# Patient Record
Sex: Female | Born: 1985 | Race: Black or African American | Hispanic: No | Marital: Single | Smoking: Never smoker
Health system: Southern US, Community
[De-identification: ages and names within clinical notes are randomized; demographics above are authoritative.]

## PROBLEM LIST (undated history)

## (undated) DIAGNOSIS — D649 Anemia, unspecified: Secondary | ICD-10-CM

## (undated) HISTORY — DX: Morbid (severe) obesity due to excess calories: E66.01

## (undated) HISTORY — DX: Anemia, unspecified: D64.9

---

## 2003-09-23 ENCOUNTER — Emergency Department (HOSPITAL_COMMUNITY): Admission: EM | Admit: 2003-09-23 | Discharge: 2003-09-23 | Payer: Self-pay | Admitting: Emergency Medicine

## 2007-10-20 ENCOUNTER — Ambulatory Visit: Payer: Self-pay | Admitting: Family Medicine

## 2010-04-29 ENCOUNTER — Emergency Department (HOSPITAL_COMMUNITY): Admission: EM | Admit: 2010-04-29 | Discharge: 2010-04-29 | Payer: Self-pay | Admitting: Emergency Medicine

## 2014-09-14 ENCOUNTER — Encounter (INDEPENDENT_AMBULATORY_CARE_PROVIDER_SITE_OTHER): Payer: Self-pay

## 2014-09-14 ENCOUNTER — Ambulatory Visit
Admission: RE | Admit: 2014-09-14 | Discharge: 2014-09-14 | Disposition: A | Discharge status: Home / Self Care | Payer: Self-pay | Source: Ambulatory Visit | Attending: Emergency Medicine | Admitting: Emergency Medicine

## 2014-09-14 ENCOUNTER — Other Ambulatory Visit: Payer: Self-pay | Admitting: Emergency Medicine

## 2014-09-14 DIAGNOSIS — R52 Pain, unspecified: Secondary | ICD-10-CM

## 2015-09-08 ENCOUNTER — Emergency Department (HOSPITAL_COMMUNITY)
Admission: EM | Admit: 2015-09-08 | Discharge: 2015-09-08 | Disposition: A | Discharge status: Home / Self Care | Payer: Self-pay | Attending: Emergency Medicine | Admitting: Emergency Medicine

## 2015-09-08 ENCOUNTER — Encounter (HOSPITAL_COMMUNITY): Payer: Self-pay | Admitting: *Deleted

## 2015-09-08 ENCOUNTER — Emergency Department (HOSPITAL_COMMUNITY)
Admission: EM | Admit: 2015-09-08 | Discharge: 2015-09-08 | Disposition: A | Discharge status: Home / Self Care | Payer: Managed Care, Other (non HMO) | Source: Home / Self Care | Attending: Emergency Medicine | Admitting: Emergency Medicine

## 2015-09-08 ENCOUNTER — Encounter (HOSPITAL_COMMUNITY): Payer: Self-pay

## 2015-09-08 DIAGNOSIS — J111 Influenza due to unidentified influenza virus with other respiratory manifestations: Secondary | ICD-10-CM | POA: Insufficient documentation

## 2015-09-08 MED ORDER — OSELTAMIVIR PHOSPHATE 75 MG PO CAPS: 75.0000 mg | ORAL_CAPSULE | Freq: Two times a day (BID) | ORAL | Status: DC

## 2015-09-08 NOTE — ED Notes (Signed)
Vomited large amount in triage after coughing.

## 2015-09-08 NOTE — ED Notes (Signed)
Pt's family member [facility].  Encouraged them to stay.

## 2015-09-08 NOTE — ED Provider Notes (Signed)
CSN: [id number]     Arrival date & time 09/08/15  1924 History   First MD Initiated Contact with Patient 09/08/15 2001     No chief complaint on file.  (Consider location/radiation/quality/duration/timing/severity/associated sxs/prior Treatment) Patient is a 30 y.o. female presenting with cough. The history is provided by the patient. No language interpreter was used.  Cough Cough characteristics:  Non-productive Severity:  Moderate Onset quality:  Sudden Duration:  2 days Timing:  Constant Progression:  Worsening Chronicity:  New Relieved by:  Nothing Worsened by:  Nothing tried Ineffective treatments:  None tried Associated symptoms: fever and sore throat   Associated symptoms: no ear pain and no rash     No past medical history on file. No past surgical history on file. No family history on file. Social History  Substance Use Topics  . Smoking status: Never Smoker   . Smokeless tobacco: Not on file  . Alcohol Use: No   OB History    No data available     Review of Systems  Constitutional: Positive for fever.  HENT: Positive for sore throat. Negative for ear pain.   Respiratory: Positive for cough.   Skin: Negative for rash.  All other systems reviewed and are negative.   Allergies  Review of patient's allergies indicates no known allergies.  Home Medications   Prior to Admission medications   Not on File   Meds Ordered and Administered this Visit  Medications - No data to display  BP 124/82 mmHg  Pulse 105  Temp(Src) 100.8 F (38.2 C) (Oral)  Resp 16  SpO2 100%  LMP 09/08/2015 No data found.   Physical Exam  Constitutional: She is oriented to person, place, and time. She appears well-developed and well-nourished.  HENT:  Head: Normocephalic.  Right Ear: External ear normal.  Left Ear: External ear normal.  Nose: Nose normal.  Mouth/Throat: Oropharynx is clear and moist.  Eyes: Conjunctivae and EOM are normal. Pupils are equal, round, and  reactive to light.  Neck: Normal range of motion.  Cardiovascular: Normal rate and normal heart sounds.   Pulmonary/Chest: Effort normal.  Abdominal: She exhibits no distension.  Musculoskeletal: Normal range of motion.  Neurological: She is alert and oriented to person, place, and time.  Skin: Skin is warm.  Psychiatric: She has a normal mood and affect.  Nursing note and vitals reviewed.   ED Course  Procedures (including critical care time)  Labs Review Labs Reviewed - No data to display  Imaging Review No results found.   Visual Acuity Review  Right Eye Distance:   Left Eye Distance:   Bilateral Distance:    Right Eye Near:   Left Eye Near:    Bilateral Near:         MDM Pt gives a good history of viral like illness,  Probably influenza   1. Influenza    Meds ordered this encounter  Medications  . oseltamivir (TAMIFLU) 75 MG capsule    Sig: Take 1 capsule (75 mg total) by mouth every 12 (twelve) hours.    Dispense:  10 capsule    Refill:  0    Order Specific Question:  Supervising Provider    Answer:  Linna HoffKINDL, JAMES D [phone][[id number]]    An After Visit Summary was printed and given to the patient.  Lonia SkinnerLeslie K MacdoelSofia, PA-C 09/08/15 2018

## 2015-09-08 NOTE — Discharge Instructions (Signed)

## 2015-09-08 NOTE — ED Notes (Signed)
Pt  Reports     Symptoms  Of  Body  Aches       X  2  Days         She   Has  A   Fever   And  The  Symptoms   Of  Fever       Have  Not  Be    releived     By  otc  med

## 2015-09-08 NOTE — ED Notes (Signed)
Pt has had flu at first of March, cough, runny nose.  Woke up this morning with body aches, worse cough, fever.  No respiratory difficulties.  Talking in complete sentences.

## 2016-03-23 ENCOUNTER — Encounter: Payer: Self-pay | Admitting: Family Medicine

## 2016-04-06 ENCOUNTER — Encounter: Payer: Self-pay | Admitting: Family Medicine

## 2016-04-06 ENCOUNTER — Ambulatory Visit (INDEPENDENT_AMBULATORY_CARE_PROVIDER_SITE_OTHER): Payer: Managed Care, Other (non HMO) | Admitting: Family Medicine

## 2016-04-06 VITALS — BP 120/80 | HR 76 | Ht 66.25 in | Wt 267.4 lb

## 2016-04-06 DIAGNOSIS — Z13220 Encounter for screening for lipoid disorders: Secondary | ICD-10-CM | POA: Diagnosis not present

## 2016-04-06 DIAGNOSIS — Z0000 Encounter for general adult medical examination without abnormal findings: Secondary | ICD-10-CM

## 2016-04-06 DIAGNOSIS — H9311 Tinnitus, right ear: Secondary | ICD-10-CM | POA: Diagnosis not present

## 2016-04-06 DIAGNOSIS — Z113 Encounter for screening for infections with a predominantly sexual mode of transmission: Secondary | ICD-10-CM | POA: Diagnosis not present

## 2016-04-06 DIAGNOSIS — Z23 Encounter for immunization: Secondary | ICD-10-CM

## 2016-04-06 HISTORY — DX: Morbid (severe) obesity due to excess calories: E66.01

## 2016-04-06 LAB — POCT URINALYSIS DIPSTICK
Bilirubin, UA: NEGATIVE
GLUCOSE UA: NEGATIVE
Ketones, UA: NEGATIVE
Leukocytes, UA: NEGATIVE
NITRITE UA: NEGATIVE
PROTEIN UA: NEGATIVE
UROBILINOGEN UA: NEGATIVE
pH, UA: 5.5

## 2016-04-06 LAB — CBC WITH DIFFERENTIAL/PLATELET
BASOS PCT: 0 %
Basophils Absolute: 0 cells/uL (ref 0–200)
EOS PCT: 3 %
Eosinophils Absolute: 315 cells/uL (ref 15–500)
HCT: 36.8 % (ref 35.0–45.0)
Hemoglobin: 11.4 g/dL — ABNORMAL LOW (ref 11.7–15.5)
LYMPHS PCT: 31 %
Lymphs Abs: 3255 cells/uL (ref 850–3900)
MCH: 24.6 pg — ABNORMAL LOW (ref 27.0–33.0)
MCHC: 31 g/dL — ABNORMAL LOW (ref 32.0–36.0)
MCV: 79.3 fL — ABNORMAL LOW (ref 80.0–100.0)
MONOS PCT: 7 %
MPV: 11.6 fL (ref 7.5–12.5)
Monocytes Absolute: 735 cells/uL (ref 200–950)
Neutro Abs: 6195 cells/uL (ref 1500–7800)
Neutrophils Relative %: 59 %
PLATELETS: 353 10*3/uL (ref 140–400)
RBC: 4.64 MIL/uL (ref 3.80–5.10)
RDW: 14.8 % (ref 11.0–15.0)
WBC: 10.5 10*3/uL (ref 4.0–10.5)

## 2016-04-06 NOTE — Progress Notes (Signed)
Subjective:    Patient ID: Katie Morgan, female    DOB: 05-Nov-1985, 30 y.o.   MRN: [id number]  HPI Chief Complaint  Patient presents with  . fasting cpe-    fasting cpe- having ringing in right ear(not sure if its cause shes around airplanes all the time). getting flu shot at work   She is new to the practice and here for a complete physical exam. She has a concern regarding hearing a "whooshing sound" in her right ear for the past 4-5 months. States this only occurs when she is laying down or when turning her head a certain way.  She gets her hearing checked at work and last year she states last year her hearing was normal. She works at [employer] and is exposed to loud noises. States she wears ear plugs most of the time.  Denies dizziness, hearing loss, headache.   Previous medical care: no recent medical care.  Last CPE: 2 years ago.   Other providers: none   Social history: Lives alone. single works at [employer].   Denies smoking, drinking alcohol, drug use  Diet: avoids soda but otherwise nothing particular.  Excerise: not active  Immunizations: tdap unknown. Flu- will get this at work.   Health maintenance:  Mammogram: N/A Colonoscopy: N/a Last Gynecological Exam: never Last Menstrual cycle: today. Regular. Bleeds 5-7 days.  Pregnancies: 0 Contraception: has female partners only Last Dental Exam: has been years.  Last Eye Exam: prescription glasses- [employer].   Wears seatbelt always, uses sunscreen, smoke detectors in home and functioning, does not text while driving and feels safe in home environment.   Reviewed allergies, medications, past medical, surgical, family, and social history.   Review of Systems Review of Systems Constitutional: -fever, -chills, -sweats, -unexpected weight change,-fatigue ENT: -runny nose, -ear pain, -sore throat Cardiology:  -chest pain, -palpitations, -edema Respiratory: -cough, -shortness of breath,  -wheezing Gastroenterology: -abdominal pain, -nausea, -vomiting, -diarrhea, -constipation  Hematology: -bleeding or bruising problems Musculoskeletal: -arthralgias, -myalgias, -joint swelling, -back pain Ophthalmology: -vision changes Urology: -dysuria, -difficulty urinating, -hematuria, -urinary frequency, -urgency Neurology: -headache, -weakness, -tingling, -numbness       Objective:   Physical Exam BP 120/80   Pulse 76   Ht 5' 6.25" (1.683 m)   Wt 267 lb 6.4 oz (121.3 kg)   LMP 04/06/2016 (Exact Date)   BMI 42.83 kg/m   General Appearance:    Alert, cooperative, no distress, appears stated age  Head:    Normocephalic, without obvious abnormality, atraumatic  Eyes:    PERRL, conjunctiva/corneas clear, EOM's intact, fundi    benign  Ears:    Normal TM's and external ear canals  Nose:   Nares normal, mucosa normal, no drainage or sinus   tenderness  Throat:   Lips, mucosa, and tongue normal; teeth and gums normal  Neck:   Supple, no lymphadenopathy;  thyroid:  no   enlargement/tenderness/nodules; no carotid   bruit or JVD  Back:    Spine nontender, no curvature, ROM normal, no CVA     tenderness  Lungs:     Clear to auscultation bilaterally without wheezes, rales or     ronchi; respirations unlabored  Chest Wall:    No tenderness or deformity   Heart:    Regular rate and rhythm, S1 and S2 normal, no murmur, rub   or gallop  Breast Exam:    Refused. Plans to reschedule for this and pap.  Abdomen:     Soft, non-tender,  nondistended, normoactive bowel sounds,    no masses, no hepatosplenomegaly  Genitalia:    Refused. Plans to reschedule for this after menses stops  Rectal:    Not performed due to age<40 and no related complaints  Extremities:   No clubbing, cyanosis or edema  Pulses:   2+ and symmetric all extremities  Skin:   Skin color, texture, turgor normal, no rashes or lesions  Lymph nodes:   Cervical, supraclavicular, and axillary nodes normal  Neurologic:   CNII-XII  intact, normal strength, sensation and gait; reflexes 2+ and symmetric throughout          Psych:   Normal mood, affect, hygiene and grooming.    Urinalysis dipstick: blood 1+ (menses)     Assessment & Plan:  Routine general medical examination at a health care facility - Plan: CBC with Differential/Platelet, Comprehensive metabolic panel, POCT urinalysis dipstick, TSH  Screening for STD (sexually transmitted disease) - Plan: HIV antibody, RPR, GC/Chlamydia Probe Amp  Need for Tdap vaccination - Plan: Tdap vaccine greater than or equal to 7yo IM  Screening for lipid disorders - Plan: Lipid panel  Severe obesity (BMI >= 40) (HCC)  Tinnitus of right ear  She passed the hearing test in the office today. Offered to refer her to ENT, patient declines and states she would like to hold off on this for now. Encouraged her to wear ear protection at work consistently. She will let me know if this worsens or if she desires a referral to ENT.  Discussed that her BMI places her in the severe obesity category and recommend that she get serious about diet and exercise. Advised that obesity increases her risk of developing chronic health conditions.  She would like to be tested for STDs. She has female partners only. States she would like to consider getting pregnant in the next 2 years.  She has never had a pap smear and she refuses today. States she started her period. Plans to return for this.  Tdap given today.  Flu shot - she will get this at her job.  Follow up pending labs.

## 2016-04-06 NOTE — Patient Instructions (Addendum)
Call your insurance company and see about a dentist and eye doctor.  We will call you with your lab results.  Return for pap smear.   Preventative Care for Adults - Female      MAINTAIN REGULAR HEALTH EXAMS:  A routine yearly physical is a good way to check in with your primary care provider about your health and preventive screening. It is also an opportunity to share updates about your health and any concerns you have, and receive a thorough all-over exam.   Most health insurance companies pay for at least some preventative services.  Check with your health plan for specific coverages.  WHAT PREVENTATIVE SERVICES DO WOMEN NEED?  Adult women should have their weight and blood pressure checked regularly.   Women age 32 and older should have their cholesterol levels checked regularly.  Women should be screened for cervical cancer with a Pap smear and pelvic exam beginning at either age 32, or 3 years after they become sexually activity.    Breast cancer screening generally begins at age 32 with a mammogram and breast exam by your primary care provider.    Beginning at age 32 and continuing to age 32, women should be screened for colorectal cancer.  Certain people may need continued testing until age 32.  Updating vaccinations is part of preventative care.  Vaccinations help protect against diseases such as the flu.  Osteoporosis is a disease in which the bones lose minerals and strength as we age. Women ages 3965 and over should discuss this with their caregivers, as should women after menopause who have other risk factors.  Lab tests are generally done as part of preventative care to screen for anemia and blood disorders, to screen for problems with the kidneys and liver, to screen for bladder problems, to check blood sugar, and to check your cholesterol level.  Preventative services generally include counseling about diet, exercise, avoiding tobacco, drugs, excessive alcohol consumption,  and sexually transmitted infections.    GENERAL RECOMMENDATIONS FOR GOOD HEALTH:  Healthy diet:  Eat a variety of foods, including fruit, vegetables, animal or vegetable protein, such as meat, fish, chicken, and eggs, or beans, lentils, tofu, and grains, such as rice.  Drink plenty of water daily.  Decrease saturated fat in the diet, avoid lots of red meat, processed foods, sweets, fast foods, and fried foods.  Exercise:  Aerobic exercise helps maintain good heart health. At least 30-40 minutes of moderate-intensity exercise is recommended. For example, a brisk walk that increases your heart rate and breathing. This should be done on most days of the week.   Find a type of exercise or a variety of exercises that you enjoy so that it becomes a part of your daily life.  Examples are running, walking, swimming, water aerobics, and biking.  For motivation and support, explore group exercise such as aerobic class, spin class, Zumba, Yoga,or  martial arts, etc.    Set exercise goals for yourself, such as a certain weight goal, walk or run in a race such as a 5k walk/run.  Speak to your primary care provider about exercise goals.  Disease prevention:  If you smoke or chew tobacco, find out from your caregiver how to quit. It can literally save your life, no matter how long you have been a tobacco user. If you do not use tobacco, never begin.   Maintain a healthy diet and normal weight. Increased weight leads to problems with blood pressure and diabetes.   The  Body Mass Index or BMI is a way of measuring how much of your body is fat. Having a BMI above 27 increases the risk of heart disease, diabetes, hypertension, stroke and other problems related to obesity. Your caregiver can help determine your BMI and based on it develop an exercise and dietary program to help you achieve or maintain this important measurement at a healthful level.  High blood pressure causes heart and blood vessel problems.   Persistent high blood pressure should be treated with medicine if weight loss and exercise do not work.   Fat and cholesterol leaves deposits in your arteries that can block them. This causes heart disease and vessel disease elsewhere in your body.  If your cholesterol is found to be high, or if you have heart disease or certain other medical conditions, then you may need to have your cholesterol monitored frequently and be treated with medication.   Ask if you should have a cardiac stress test if your history suggests this. A stress test is a test done on a treadmill that looks for heart disease. This test can find disease prior to there being a problem.  Menopause can be associated with physical symptoms and risks. Hormone replacement therapy is available to decrease these. You should talk to your caregiver about whether starting or continuing to take hormones is right for you.   Osteoporosis is a disease in which the bones lose minerals and strength as we age. This can result in serious bone fractures. Risk of osteoporosis can be identified using a bone density scan. Women ages 59 and over should discuss this with their caregivers, as should women after menopause who have other risk factors. Ask your caregiver whether you should be taking a calcium supplement and Vitamin D, to reduce the rate of osteoporosis.   Avoid drinking alcohol in excess (more than two drinks per day).  Avoid use of street drugs. Do not share needles with anyone. Ask for professional help if you need assistance or instructions on stopping the use of alcohol, cigarettes, and/or drugs.  Brush your teeth twice a day with fluoride toothpaste, and floss once a day. Good oral hygiene prevents tooth decay and gum disease. The problems can be painful, unattractive, and can cause other health problems. Visit your dentist for a routine oral and dental check up and preventive care every 6-12 months.   Look at your skin regularly.  Use a  mirror to look at your back. Notify your caregivers of changes in moles, especially if there are changes in shapes, colors, a size larger than a pencil eraser, an irregular border, or development of new moles.  Safety:  Use seatbelts 100% of the time, whether driving or as a passenger.  Use safety devices such as hearing protection if you work in environments with loud noise or significant background noise.  Use safety glasses when doing any work that could send debris in to the eyes.  Use a helmet if you ride a bike or motorcycle.  Use appropriate safety gear for contact sports.  Talk to your caregiver about gun safety.  Use sunscreen with a SPF (or skin protection factor) of 15 or greater.  Lighter skinned people are at a greater risk of skin cancer. Don't forget to also wear sunglasses in order to protect your eyes from too much damaging sunlight. Damaging sunlight can accelerate cataract formation.   Practice safe sex. Use condoms. Condoms are used for birth control and to help reduce the spread  of sexually transmitted infections (or STIs).  Some of the STIs are gonorrhea (the clap), chlamydia, syphilis, trichomonas, herpes, HPV (human papilloma virus) and HIV (human immunodeficiency virus) which causes AIDS. The herpes, HIV and HPV are viral illnesses that have no cure. These can result in disability, cancer and death.   Keep carbon monoxide and smoke detectors in your home functioning at all times. Change the batteries every 6 months or use a model that plugs into the wall.   Vaccinations:  Stay up to date with your tetanus shots and other required immunizations. You should have a booster for tetanus every 10 years. Be sure to get your flu shot every year, since 5%-20% of the [place] population comes down with the flu. The flu vaccine changes each year, so being vaccinated once is not enough. Get your shot in the fall, before the flu season peaks.   Other vaccines to consider:  Human Papilloma  Virus or HPV causes cancer of the cervix, and other infections that can be transmitted from person to person. There is a vaccine for HPV, and females should get immunized between the ages of 3 and 28. It requires a series of 3 shots.   Pneumococcal vaccine to protect against certain types of pneumonia.  This is normally recommended for adults age 78 or older.  However, adults younger than 32 years old with certain underlying conditions such as diabetes, heart or lung disease should also receive the vaccine.  Shingles vaccine to protect against Varicella Zoster if you are older than age 73, or younger than 32 years old with certain underlying illness.  Hepatitis A vaccine to protect against a form of infection of the liver by a virus acquired from food.  Hepatitis B vaccine to protect against a form of infection of the liver by a virus acquired from blood or body fluids, particularly if you work in health care.  If you plan to travel internationally, check with your local health department for specific vaccination recommendations.  Cancer Screening:  Breast cancer screening is essential to preventive care for women. All women age 48 and older should perform a breast self-exam every month. At age 20 and older, women should have their caregiver complete a breast exam each year. Women at ages 69 and older should have a mammogram (x-ray film) of the breasts. Your caregiver can discuss how often you need mammograms.    Cervical cancer screening includes taking a Pap smear (sample of cells examined under a microscope) from the cervix (end of the uterus). It also includes testing for HPV (Human Papilloma Virus, which can cause cervical cancer). Screening and a pelvic exam should begin at age 73, or 3 years after a woman becomes sexually active. Screening should occur every year, with a Pap smear but no HPV testing, up to age 42. After age 48, you should have a Pap smear every 3 years with HPV testing, if no  HPV was found previously.   Most routine colon cancer screening begins at the age of 77. On a yearly basis, doctors may provide special easy to use take-home tests to check for hidden blood in the stool. Sigmoidoscopy or colonoscopy can detect the earliest forms of colon cancer and is life saving. These tests use a small camera at the end of a tube to directly examine the colon. Speak to your caregiver about this at age 60, when routine screening begins (and is repeated every 5 years unless early forms of pre-cancerous polyps or small  growths are found).

## 2016-04-07 LAB — COMPREHENSIVE METABOLIC PANEL WITH GFR
ALT: 13 U/L (ref 6–29)
AST: 16 U/L (ref 10–30)
Albumin: 3.7 g/dL (ref 3.6–5.1)
Alkaline Phosphatase: 65 U/L (ref 33–115)
BUN: 11 mg/dL (ref 7–25)
CO2: 26 mmol/L (ref 20–31)
Calcium: 9.2 mg/dL (ref 8.6–10.2)
Chloride: 105 mmol/L (ref 98–110)
Creat: 0.71 mg/dL (ref 0.50–1.10)
Glucose, Bld: 96 mg/dL (ref 65–99)
Potassium: 3.9 mmol/L (ref 3.5–5.3)
Sodium: 140 mmol/L (ref 135–146)
Total Bilirubin: 0.3 mg/dL (ref 0.2–1.2)
Total Protein: 7.5 g/dL (ref 6.1–8.1)

## 2016-04-07 LAB — TSH: TSH: 1.43 m[IU]/L

## 2016-04-07 LAB — HIV ANTIBODY (ROUTINE TESTING W REFLEX): HIV 1&2 Ab, 4th Generation: NONREACTIVE

## 2016-04-07 LAB — LIPID PANEL
Cholesterol: 174 mg/dL (ref 125–200)
HDL: 54 mg/dL
LDL Cholesterol: 108 mg/dL
Total CHOL/HDL Ratio: 3.2 ratio
Triglycerides: 60 mg/dL
VLDL: 12 mg/dL

## 2016-04-07 LAB — GC/CHLAMYDIA PROBE AMP
CT Probe RNA: NOT DETECTED
GC Probe RNA: NOT DETECTED

## 2016-04-07 LAB — SYPHILIS: RPR W/REFLEX TO RPR TITER AND TREPONEMAL ANTIBODIES, TRADITIONAL SCREENING AND DIAGNOSIS ALGORITHM

## 2016-04-22 ENCOUNTER — Other Ambulatory Visit: Payer: Managed Care, Other (non HMO) | Admitting: Family Medicine

## 2016-05-20 ENCOUNTER — Other Ambulatory Visit (HOSPITAL_COMMUNITY)
Admission: RE | Admit: 2016-05-20 | Discharge: 2016-05-20 | Disposition: A | Discharge status: Home / Self Care | Payer: Managed Care, Other (non HMO) | Source: Ambulatory Visit | Attending: Family Medicine | Admitting: Family Medicine

## 2016-05-20 ENCOUNTER — Encounter: Payer: Self-pay | Admitting: Family Medicine

## 2016-05-20 ENCOUNTER — Ambulatory Visit (INDEPENDENT_AMBULATORY_CARE_PROVIDER_SITE_OTHER): Payer: Managed Care, Other (non HMO) | Admitting: Family Medicine

## 2016-05-20 VITALS — BP 130/78 | HR 64 | Wt 272.2 lb

## 2016-05-20 DIAGNOSIS — Z124 Encounter for screening for malignant neoplasm of cervix: Secondary | ICD-10-CM

## 2016-05-20 DIAGNOSIS — Z01419 Encounter for gynecological examination (general) (routine) without abnormal findings: Secondary | ICD-10-CM | POA: Diagnosis present

## 2016-05-20 DIAGNOSIS — Z1151 Encounter for screening for human papillomavirus (HPV): Secondary | ICD-10-CM | POA: Diagnosis not present

## 2016-05-20 DIAGNOSIS — D509 Iron deficiency anemia, unspecified: Secondary | ICD-10-CM | POA: Diagnosis not present

## 2016-05-20 NOTE — Patient Instructions (Signed)
We will call you with your pap smear results.   you are anemic and may benefit from taking daily iron.

## 2016-05-20 NOTE — Progress Notes (Signed)
   Subjective:    Patient ID: Katie Morgan, female    DOB: [birth year], 30 y.o.   MRN: [id number]  HPI Chief Complaint  Patient presents with  . pap    pap only.   She is here today for a Pap smear. She was here for a complete physical exam on 04/06/2016 but was having her menses at that time and deferred the Pap smear. Unknown last pap smear. Denies history of abnormal pap smear. Denies family history of cervical cancer.   She is aware that she is anemic. States this has been ongoing for years related to her periods.   Denies fever, chills, chest pain, shortness of breath, abdominal pain, N/V/D, vaginal discharge.   Reviewed allergies, medications, past medical, surgical, family, and social history.   Review of Systems Pertinent positives and negatives in the history of present illness.     Objective:   Physical Exam  Constitutional: She appears well-developed and well-nourished. No distress.  Genitourinary: Vagina normal and uterus normal. There is no rash, tenderness or lesion on the right labia. There is no rash, tenderness or lesion on the left labia. Cervix exhibits no motion tenderness and no discharge. Right adnexum displays no mass, no tenderness and no fullness. Left adnexum displays no mass, no tenderness and no fullness.  Genitourinary Comments: Chaperone present. Patient tolerated well.   Lymphadenopathy:       Right: No inguinal adenopathy present.       Left: No inguinal adenopathy present.  Skin: Skin is warm and dry. No pallor.   BP 130/78   Pulse 64   Wt 272 lb 3.2 oz (123.5 kg)   BMI 43.60 kg/m       Assessment & Plan:  Encounter for screening for cervical cancer  - Plan: Cytology - PAP  Iron deficiency anemia, unspecified iron deficiency anemia type  Discussed taking iron for anemia and the possibility of constipation with iron. Discussed management of constipation for this.  Follow up pending pap smear results.

## 2016-05-22 LAB — CYTOLOGY - PAP
DIAGNOSIS: NEGATIVE
HPV: NOT DETECTED

## 2017-02-23 ENCOUNTER — Encounter: Payer: Self-pay | Admitting: Family Medicine

## 2017-02-25 ENCOUNTER — Ambulatory Visit: Payer: Managed Care, Other (non HMO) | Admitting: Family Medicine

## 2018-07-26 ENCOUNTER — Ambulatory Visit: Payer: Commercial Managed Care - PPO | Admitting: Family Medicine

## 2018-07-26 ENCOUNTER — Encounter: Payer: Self-pay | Admitting: Family Medicine

## 2018-07-26 VITALS — BP 130/80 | HR 77 | Temp 98.7°F | Wt 273.8 lb

## 2018-07-26 DIAGNOSIS — R110 Nausea: Secondary | ICD-10-CM | POA: Diagnosis not present

## 2018-07-26 DIAGNOSIS — R197 Diarrhea, unspecified: Secondary | ICD-10-CM

## 2018-07-26 MED ORDER — ONDANSETRON 4 MG PO TBDP: 4.0000 mg | ORAL_TABLET | Freq: Three times a day (TID) | ORAL | 0 refills | Status: DC | PRN

## 2018-07-26 NOTE — Progress Notes (Signed)
   Subjective:    Patient ID: Katie Morgan, female    DOB: [birth year], 33 y.o.   MRN: [id number]  HPI Chief Complaint  Patient presents with  . stomach issues    stomach issues- ate something on sunday and had vomitting and diarrhea all night and still feels queezy   She is here with complaints of a 2 day history of N/V/D.  States she ate a buffalo chicken dip 2 days ago and approximately 20 minutes later she was having nausea, vomiting and diarrhea. She still has nausea but no longer vomiting.  Last bowel movement was yesterday evening. Denies blood or pus in her stool.   Drinking ginger ale and crackers and keeping it down. She has not taken anything for her symptoms.   Denies fever, chills, dizziness, headache, chest pain, palpitations, abdominal pain, urinary symptoms.    LMP: 3 weeks ago.  Contraception: same-sex relationship.   Reviewed allergies, medications, past medical, surgical, family, and social history.   Review of Systems Pertinent positives and negatives in the history of present illness.     Objective:   Physical Exam BP 130/80   Pulse 77   Temp 98.7 F (37.1 C) (Oral)   Wt 273 lb 12.8 oz (124.2 kg)   SpO2 98%   BMI 43.86 kg/m   Alert and oriented and in no distress. Tympanic membranes and canals are normal. Pharyngeal area is normal. Neck is supple without adenopathy or thyromegaly. Cardiac exam shows a regular sinus rhythm without murmurs or gallops. Lungs are clear to auscultation. Abdomen is soft, non distended, non tender, normal BS, no palpable masses. Extremities without edema. Skin is warm and dry, no pallor or rash. PERRLA, conjunctiva normal.        Assessment & Plan:  Diarrhea, unspecified type  Nausea - Plan: ondansetron (ZOFRAN ODT) 4 MG disintegrating tablet  Discussed whether her illness is related to a viral etiology versus foodborne illness, treatment is the same.  She will continue with symptomatic treatment.  She appears to be  improving significantly as expected.  Stay well-hydrated.  Zofran prescribed.  She will follow-up if symptoms are worsening.  Declines work note.

## 2018-07-26 NOTE — Patient Instructions (Addendum)
Make sure you are staying well-hydrated.  You can take the Zofran for nausea as needed.  I sent this to your pharmacy.  I recommend that you continue to avoid fried foods, greasy foods or spicy foods for the next couple of days.  I would also be careful with milk products.  If you develop worsening symptoms, let me know.

## 2018-08-07 DIAGNOSIS — E8881 Metabolic syndrome: Secondary | ICD-10-CM | POA: Diagnosis not present

## 2018-08-08 NOTE — Progress Notes (Signed)
Subjective:    Patient ID: Katie Morgan, female    DOB: Jan 28, 1986, 33 y.o.   MRN: [id number]  HPI  Chief Complaint  Patient presents with  . fasting cpe    fasitng cpe, gets eyes checked yearly. wants pap smear done and STD check   She is here for a complete physical exam.  Concerns regarding weight. Planning to start eating healthier and cutting back on soda and fried foods.   Other providers: [facility]- eyes  Dr. Lawrence Marseilles- dentist   Social history: Lives alone, works for [employer]  Denies smoking, drinking alcohol, drug use  Diet: has been unhealthy using an app for calories.  Excerise: M, W, F   Immunizations: flu shot at work. Tdap UTD.   Health maintenance:  Mammogram: N/A Colonoscopy: N/A Last Gynecological Exam: 2017. Requests pap smear.  Last Menstrual cycle: 08/03/2018 Pregnancies: 0 Last Dental Exam: Dr. Lawrence Marseilles  Last Eye Exam: fall 2019   Wears seatbelt always, uses sunscreen, smoke detectors in home and functioning, does not text while driving and feels safe in home environment.   Reviewed allergies, medications, past medical, surgical, family, and social history.   Review of Systems Review of Systems Constitutional: -fever, -chills, -sweats, -unexpected weight change,-fatigue ENT: -runny nose, -ear pain, -sore throat Cardiology:  -chest pain, -palpitations, -edema Respiratory: -cough, -shortness of breath, -wheezing Gastroenterology: -abdominal pain, -nausea, -vomiting, -diarrhea, -constipation  Hematology: -bleeding or bruising problems Musculoskeletal: -arthralgias, -myalgias, -joint swelling, -back pain Ophthalmology: -vision changes Urology: -dysuria, -difficulty urinating, -hematuria, -urinary frequency, -urgency Neurology: -headache, -weakness, -tingling, -numbness       Objective:   Physical Exam BP 120/72   Pulse 72   Ht 5' 5.5" (1.664 m)   Wt 274 lb 6.4 oz (124.5 kg)   LMP 08/03/2018   BMI 44.97 kg/m   General Appearance:     Alert, cooperative, no distress, appears stated age  Head:    Normocephalic, without obvious abnormality, atraumatic  Eyes:    PERRL, conjunctiva/corneas clear, EOM's intact, fundi    benign  Ears:    Normal TM's and external ear canals  Nose:   Nares normal, mucosa normal, no drainage or sinus   tenderness  Throat:   Lips, mucosa, and tongue normal; teeth and gums normal  Neck:   Supple, no lymphadenopathy;  thyroid:  no   enlargement/tenderness/nodules; no carotid   bruit or JVD  Back:    Spine nontender, no curvature, ROM normal, no CVA     tenderness  Lungs:     Clear to auscultation bilaterally without wheezes, rales or     ronchi; respirations unlabored  Chest Wall:    No tenderness or deformity   Heart:    Regular rate and rhythm, S1 and S2 normal, no murmur, rub   or gallop  Breast Exam:    No tenderness, masses, or nipple discharge or inversion.      No axillary lymphadenopathy  Abdomen:     Soft, non-tender, nondistended, normoactive bowel sounds,    no masses, no hepatosplenomegaly  Genitalia:    Normal external genitalia without lesions.  BUS and vagina normal; cervix without lesions, or cervical motion tenderness. No abnormal vaginal discharge.  Uterus and adnexa not enlarged, nontender, no masses.  Pap performed. Chaperone present.   Rectal:    Not performed due to age<40 and no related complaints  Extremities:   No clubbing, cyanosis or edema  Pulses:   2+ and symmetric all extremities  Skin:   Skin  color, texture, turgor normal, no rashes or lesions  Lymph nodes:   Cervical, supraclavicular, and axillary nodes normal  Neurologic:   CNII-XII intact, normal strength, sensation and gait; reflexes 2+ and symmetric throughout          Psych:   Normal mood, affect, hygiene and grooming.    Urinalysis dipstick: Patient was unable to void      Assessment & Plan:  Routine general medical examination at a health care facility - Plan: CBC with Differential/Platelet, Comprehensive  metabolic panel, TSH, T4, free, Lipid panel, CANCELED: POCT Urinalysis DIP (Proadvantage Device)  Iron deficiency anemia, unspecified iron deficiency anemia type - Plan: CBC with Differential/Platelet  Severe obesity (BMI >= 40) (HCC) - Plan: Comprehensive metabolic panel, TSH, T4, free, Hemoglobin A1c, Lipid panel  Screening for cervical cancer - Plan: Cytology - PAP([place])  Screen for STD (sexually transmitted disease) - Plan: RPR, HIV Antibody (routine testing w rflx), Hepatitis C antibody  Screening for diabetes mellitus - Plan: Hemoglobin A1c  She is a pleasant 33 year old female who is here for a fasting CPE.  She has a form for her employer that will need to be filled out once we have her lipids. History of iron deficiency anemia.  Not currently taking iron.  Does not appear to be symptomatic.  We will recheck CBC. Severe obesity-discussed potential long-term health consequences related to obesity including diabetes, hypertension, cancer and arthritis.  In-depth counseling on healthy diet and activity level to reduce weight.  Also discussed possibility of weight loss medication, Bernie Covey, she will call her insurance company and let me know if this is affordable.  She will then need to come back in for an office visit. Discussed that her Pap smear was only 3 years ago and she was negative for HPV.  She requests to have a Pap smear today.  This was done and a chaperone was present.  She also requests STD testing. She is considering the possibility of getting a donor and having a child within the next 2 to 3 years. Immunizations up-to-date.  We did discuss HPV vaccine and she will check with her insurance company to see if this is affordable.  She may return for nurse visit if this is. Follow-up pending lab results.

## 2018-08-09 ENCOUNTER — Encounter: Payer: Self-pay | Admitting: Family Medicine

## 2018-08-09 ENCOUNTER — Ambulatory Visit: Payer: Commercial Managed Care - PPO | Admitting: Family Medicine

## 2018-08-09 ENCOUNTER — Other Ambulatory Visit (HOSPITAL_COMMUNITY)
Admission: RE | Admit: 2018-08-09 | Discharge: 2018-08-09 | Disposition: A | Discharge status: Home / Self Care | Payer: Commercial Managed Care - PPO | Source: Ambulatory Visit | Attending: Family Medicine | Admitting: Family Medicine

## 2018-08-09 VITALS — BP 120/72 | HR 72 | Ht 65.5 in | Wt 274.4 lb

## 2018-08-09 DIAGNOSIS — D509 Iron deficiency anemia, unspecified: Secondary | ICD-10-CM

## 2018-08-09 DIAGNOSIS — Z0000 Encounter for general adult medical examination without abnormal findings: Secondary | ICD-10-CM | POA: Diagnosis not present

## 2018-08-09 DIAGNOSIS — Z124 Encounter for screening for malignant neoplasm of cervix: Secondary | ICD-10-CM | POA: Insufficient documentation

## 2018-08-09 DIAGNOSIS — Z113 Encounter for screening for infections with a predominantly sexual mode of transmission: Secondary | ICD-10-CM

## 2018-08-09 DIAGNOSIS — D649 Anemia, unspecified: Secondary | ICD-10-CM | POA: Insufficient documentation

## 2018-08-09 DIAGNOSIS — Z131 Encounter for screening for diabetes mellitus: Secondary | ICD-10-CM

## 2018-08-09 NOTE — Patient Instructions (Addendum)
You can call your insurance company and find out if [place], a weight loss medication, is affordable for you.  Call and schedule appointment to discuss this if it is. You can also check and see if the HPV vaccine is affordable for you.  This is a 3 shot series as we discussed.  You can call and schedule a nurse visit to get this.  Cut back on carbohydrates (potatoes, rice, pasta, bread)    Preventive Care 18-39 Years, Female Preventive care refers to lifestyle choices and visits with your health care provider that can promote health and wellness. What does preventive care include?   A yearly physical exam. This is also called an annual well check.  Dental exams once or twice a year.  Routine eye exams. Ask your health care provider how often you should have your eyes checked.  Personal lifestyle choices, including: ? Daily care of your teeth and gums. ? Regular physical activity. ? Eating a healthy diet. ? Avoiding tobacco and drug use. ? Limiting alcohol use. ? Practicing safe sex. ? Taking vitamin and mineral supplements as recommended by your health care provider. What happens during an annual well check? The services and screenings done by your health care provider during your annual well check will depend on your age, overall health, lifestyle risk factors, and family history of disease. Counseling Your health care provider may ask you questions about your:  Alcohol use.  Tobacco use.  Drug use.  Emotional well-being.  Home and relationship well-being.  Sexual activity.  Eating habits.  Work and work Statistician.  Method of birth control.  Menstrual cycle.  Pregnancy history. Screening You may have the following tests or measurements:  Height, weight, and BMI.  Diabetes screening. This is done by checking your blood sugar (glucose) after you have not eaten for a while (fasting).  Blood pressure.  Lipid and cholesterol levels. These may be checked  every 5 years starting at age 38.  Skin check.  Hepatitis C blood test.  Hepatitis B blood test.  Sexually transmitted disease (STD) testing.  BRCA-related cancer screening. This may be done if you have a family history of breast, ovarian, tubal, or peritoneal cancers.  Pelvic exam and Pap test. This may be done every 3 years starting at age 54. Starting at age 36, this may be done every 5 years if you have a Pap test in combination with an HPV test. Discuss your test results, treatment options, and if necessary, the need for more tests with your health care provider. Vaccines Your health care provider may recommend certain vaccines, such as:  Influenza vaccine. This is recommended every year.  Tetanus, diphtheria, and acellular pertussis (Tdap, Td) vaccine. You may need a Td booster every 10 years.  Varicella vaccine. You may need this if you have not been vaccinated.  HPV vaccine. If you are 64 or younger, you may need three doses over 6 months.  Measles, mumps, and rubella (MMR) vaccine. You may need at least one dose of MMR. You may also need a second dose.  Pneumococcal 13-valent conjugate (PCV13) vaccine. You may need this if you have certain conditions and were not previously vaccinated.  Pneumococcal polysaccharide (PPSV23) vaccine. You may need one or two doses if you smoke cigarettes or if you have certain conditions.  Meningococcal vaccine. One dose is recommended if you are age 62-21 years and a first-year college student living in a residence hall, or if you have one of several medical conditions.  You may also need additional booster doses.  Hepatitis A vaccine. You may need this if you have certain conditions or if you travel or work in places where you may be exposed to hepatitis A.  Hepatitis B vaccine. You may need this if you have certain conditions or if you travel or work in places where you may be exposed to hepatitis B.  Haemophilus influenzae type b (Hib)  vaccine. You may need this if you have certain risk factors. Talk to your health care provider about which screenings and vaccines you need and how often you need them. This information is not intended to replace advice given to you by your health care provider. Make sure you discuss any questions you have with your health care provider. Document Released: 07/28/2001 Document Revised: 01/12/2017 Document Reviewed: 04/02/2015 Elsevier Interactive Patient Education  2019 [employer].

## 2018-08-10 LAB — CBC WITH DIFFERENTIAL/PLATELET
BASOS ABS: 0.1 10*3/uL (ref 0.0–0.2)
Basos: 1 %
EOS (ABSOLUTE): 0.1 10*3/uL (ref 0.0–0.4)
Eos: 1 %
HEMOGLOBIN: 9.4 g/dL — AB (ref 11.1–15.9)
Hematocrit: 31.1 % — ABNORMAL LOW (ref 34.0–46.6)
IMMATURE GRANS (ABS): 0 10*3/uL (ref 0.0–0.1)
Immature Granulocytes: 0 %
LYMPHS: 30 %
Lymphocytes Absolute: 3.2 10*3/uL — ABNORMAL HIGH (ref 0.7–3.1)
MCH: 21.4 pg — AB (ref 26.6–33.0)
MCHC: 30.2 g/dL — ABNORMAL LOW (ref 31.5–35.7)
MCV: 71 fL — AB (ref 79–97)
MONOCYTES: 6 %
Monocytes Absolute: 0.6 10*3/uL (ref 0.1–0.9)
Neutrophils Absolute: 6.8 10*3/uL (ref 1.4–7.0)
Neutrophils: 62 %
PLATELETS: 369 10*3/uL (ref 150–450)
RBC: 4.39 x10E6/uL (ref 3.77–5.28)
RDW: 16.2 % — ABNORMAL HIGH (ref 11.7–15.4)
WBC: 10.9 10*3/uL — AB (ref 3.4–10.8)

## 2018-08-10 LAB — HIV ANTIBODY (ROUTINE TESTING W REFLEX): HIV Screen 4th Generation wRfx: NONREACTIVE

## 2018-08-10 LAB — COMPREHENSIVE METABOLIC PANEL
A/G RATIO: 1.2 (ref 1.2–2.2)
ALBUMIN: 3.9 g/dL (ref 3.8–4.8)
ALT: 12 IU/L (ref 0–32)
AST: 12 IU/L (ref 0–40)
Alkaline Phosphatase: 79 IU/L (ref 39–117)
BUN / CREAT RATIO: 19 (ref 9–23)
BUN: 13 mg/dL (ref 6–20)
Bilirubin Total: <0.2 mg/dL (ref 0.0–1.2)
CO2: 23 mmol/L (ref 20–29)
Calcium: 9.2 mg/dL (ref 8.7–10.2)
Chloride: 104 mmol/L (ref 96–106)
Creatinine, Ser: 0.7 mg/dL (ref 0.57–1.00)
GFR calc non Af Amer: 113 mL/min/{1.73_m2} (ref >59)
GFR, EST AFRICAN AMERICAN: 131 mL/min/{1.73_m2} (ref >59)
Globulin, Total: 3.3 g/dL (ref 1.5–4.5)
Glucose: 86 mg/dL (ref 65–99)
POTASSIUM: 4 mmol/L (ref 3.5–5.2)
Sodium: 142 mmol/L (ref 134–144)
TOTAL PROTEIN: 7.2 g/dL (ref 6.0–8.5)

## 2018-08-10 LAB — LIPID PANEL
CHOLESTEROL TOTAL: 177 mg/dL (ref 100–199)
Chol/HDL Ratio: 2.9 ratio (ref 0.0–4.4)
HDL: 61 mg/dL (ref >39)
LDL CALC: 105 mg/dL — AB (ref 0–99)
TRIGLYCERIDES: 57 mg/dL (ref 0–149)
VLDL Cholesterol Cal: 11 mg/dL (ref 5–40)

## 2018-08-10 LAB — CYTOLOGY - PAP
CHLAMYDIA, DNA PROBE: NEGATIVE
DIAGNOSIS: NEGATIVE
HPV: NOT DETECTED
Neisseria Gonorrhea: NEGATIVE
Trichomonas: NEGATIVE

## 2018-08-10 LAB — HEMOGLOBIN A1C
Est. average glucose Bld gHb Est-mCnc: 103 mg/dL
Hgb A1c MFr Bld: 5.2 % (ref 4.8–5.6)

## 2018-08-10 LAB — HEPATITIS C ANTIBODY: Hep C Virus Ab: <0.1 s/co ratio (ref 0.0–0.9)

## 2018-08-10 LAB — RPR: RPR: NONREACTIVE

## 2018-08-10 LAB — T4, FREE: Free T4: 1.19 ng/dL (ref 0.82–1.77)

## 2018-08-10 LAB — TSH: TSH: 2.08 u[IU]/mL (ref 0.450–4.500)

## 2018-08-15 DIAGNOSIS — E8881 Metabolic syndrome: Secondary | ICD-10-CM | POA: Diagnosis not present

## 2018-09-25 DIAGNOSIS — E8881 Metabolic syndrome: Secondary | ICD-10-CM | POA: Diagnosis not present

## 2018-10-10 ENCOUNTER — Ambulatory Visit: Payer: Commercial Managed Care - PPO | Admitting: Family Medicine

## 2019-01-30 ENCOUNTER — Encounter: Payer: Self-pay | Admitting: Family Medicine

## 2019-04-27 ENCOUNTER — Encounter: Payer: Self-pay | Admitting: Family Medicine

## 2019-05-04 ENCOUNTER — Encounter: Payer: Self-pay | Admitting: Family Medicine

## 2019-05-04 ENCOUNTER — Ambulatory Visit: Payer: Commercial Managed Care - PPO | Admitting: Family Medicine

## 2019-05-04 VITALS — BP 120/70 | HR 76 | Temp 96.6°F | Wt 284.8 lb

## 2019-05-04 DIAGNOSIS — M25612 Stiffness of left shoulder, not elsewhere classified: Secondary | ICD-10-CM

## 2019-05-04 DIAGNOSIS — M25512 Pain in left shoulder: Secondary | ICD-10-CM

## 2019-05-04 DIAGNOSIS — G8929 Other chronic pain: Secondary | ICD-10-CM

## 2019-05-04 NOTE — Progress Notes (Signed)
   Subjective:    Patient ID: Katie Morgan, female    DOB: 1985-10-04, 33 y.o.   MRN: [id number]  HPI Chief Complaint  Patient presents with  . left arm/shoulder pain    pain for the last year.    Complains of left shoulder pain x 1 year. The pain is intermittent and worsening. States she has limited ROM. Pain is mostly lateral and is non radiating.  States it feels like it is getting "stuck" in certain positions. States pain is severe when she lays on her left side.  No numbness, tingling or weakness.  Denies fever, chills, N/V/D.  No other arthralgias or myalgias.  She has not been taking medication for pain.   Builds airplanes for a living.   Reviewed allergies, medications, past medical, surgical, family, and social history.  Review of Systems Pertinent positives and negatives in the history of present illness.     Objective:   Physical Exam Constitutional:      General: She is not in acute distress.    Appearance: She is not ill-appearing.  Neck:     Musculoskeletal: Normal range of motion and neck supple.  Musculoskeletal:     Left shoulder: She exhibits decreased range of motion. She exhibits no swelling, normal pulse and normal strength.     Cervical back: Normal.     Comments: Left shoulder- limited ROM. No sulcus, negative drop arm, negative Neers and Hawkins.  LUE is neurovascularly intact   Skin:    General: Skin is warm and dry.     Capillary Refill: Capillary refill takes less than 2 seconds.  Neurological:     Mental Status: She is alert.    BP 120/70   Pulse 76   Temp (!) 96.6 F (35.9 C)   Wt 284 lb 12.8 oz (129.2 kg)   BMI 46.67 kg/m       Assessment & Plan:  Chronic left shoulder pain - Plan: Ambulatory referral to Orthopedic Surgery  Decreased range of motion of left shoulder - Plan: Ambulatory referral to Orthopedic Surgery  1 year hx of left shoulder pain that has not been evaluated.  Discussed possible treatment options including X  ray and PT or referral to ortho. She would like to establish care with an orthopedist.  Voltaren gel sample provided.

## 2019-05-04 NOTE — Patient Instructions (Signed)
You can use the Voltaren gel as needed.   You should receive a call from [facility] to schedule a visit.

## 2019-05-09 ENCOUNTER — Encounter: Payer: Self-pay | Admitting: Orthopaedic Surgery

## 2019-05-09 ENCOUNTER — Ambulatory Visit: Payer: Commercial Managed Care - PPO | Admitting: Orthopaedic Surgery

## 2019-05-09 ENCOUNTER — Other Ambulatory Visit: Payer: Self-pay

## 2019-05-09 ENCOUNTER — Ambulatory Visit: Payer: Self-pay

## 2019-05-09 DIAGNOSIS — M25512 Pain in left shoulder: Secondary | ICD-10-CM

## 2019-05-09 DIAGNOSIS — M25511 Pain in right shoulder: Secondary | ICD-10-CM

## 2019-05-09 DIAGNOSIS — G8929 Other chronic pain: Secondary | ICD-10-CM | POA: Diagnosis not present

## 2019-05-09 MED ORDER — BUPIVACAINE HCL 0.25 % IJ SOLN
2.0000 mL | INTRAMUSCULAR | Status: AC | PRN
  Administered 2019-05-09: 16:00:00 2 mL via INTRA_ARTICULAR

## 2019-05-09 MED ORDER — LIDOCAINE HCL 2 % IJ SOLN
2.0000 mL | INTRAMUSCULAR | Status: AC | PRN
  Administered 2019-05-09: 16:00:00 2 mL

## 2019-05-09 MED ORDER — METHYLPREDNISOLONE ACETATE 40 MG/ML IJ SUSP
40.0000 mg | INTRAMUSCULAR | Status: AC | PRN
  Administered 2019-05-09: 16:00:00 40 mg via INTRA_ARTICULAR

## 2019-05-09 NOTE — Progress Notes (Signed)
Office Visit Note   Patient: Katie Morgan           Date of Birth: 04-22-1986           MRN: [id number] Visit Date: 05/09/2019              Requested by: Girtha Rm, NP-C [place],  [place] [address] PCP: Girtha Rm, NP-C   Assessment & Plan: Visit Diagnoses:  1. Chronic left shoulder pain     Plan: Impression is left shoulder subacromial bursitis and biceps tendinitis.  We will proceed with left shoulder subacromial cortisone injection today.  She will follow up with [place] as needed.  Follow-Up Instructions: Return if symptoms worsen or fail to improve.   Orders:  Orders Placed This Encounter  Procedures  . Large Joint Inj: L subacromial bursa  . XR Shoulder Left   No orders of the defined types were placed in this encounter.     Procedures: Large Joint Inj: L subacromial bursa on 05/09/2019 3:47 PM Indications: pain Details: 22 G needle Medications: 2 mL bupivacaine 0.25 %; 2 mL lidocaine 2 %; 40 mg methylPREDNISolone acetate 40 MG/ML Outcome: tolerated well, no immediate complications Patient was prepped and draped in the usual sterile fashion.       Clinical Data: No additional findings.   Subjective: Chief Complaint  Patient presents with  . Left Shoulder - Pain    HPI patient is a pleasant 33 year old female who presents to our clinic today with left shoulder pain.  This began approximately 1 year ago and has worsened over time.  No known injury or change in activity.  The pain she has is to the anterior shoulder and occasionally radiates into the deltoid.  No associated weakness.  She has pain with abduction and abduction of the shoulder.  She has tried anti-inflammatory cream without relief of symptoms.  She denies any numbness, tingling or burning.  No previous cortisone injection to the shoulder.  Review of Systems as detailed in HPI.  All others reviewed and are negative.   Objective: Vital Signs: There were no vitals  taken for this visit.  Physical Exam well-developed and well-nourished female in no acute distress.  Alert and oriented x3.  Ortho Exam examination of the left shoulder she has full active range of motion.  She can internally rotate to T12.  Positive empty can.  Positive cross body adduction.  Positive speeds test.  No tenderness to the [facility] joint.  She does have moderate tenderness to the bicipital groove.  She has full strength throughout.  She is neurovascularly intact distally.  Specialty Comments:  No specialty comments available.  Imaging: Xr Shoulder Left  Result Date: 05/09/2019 No acute or structural abnormalities    PMFS History: Patient Active Problem List   Diagnosis Date Noted  . Anemia   . Morbid obesity ([place])   . Anemia, iron deficiency 05/20/2016  . Severe obesity (BMI >= 40) ([place]) 04/06/2016  . 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 Anemia   . Morbid obesity ([place])     Family History  Problem Relation Age of Onset  . Obesity Mother   . Obesity Father   . Cancer Maternal Grandmother        Leu Gehrigs  . Breast cancer Maternal Aunt 50    History reviewed. No pertinent surgical history. Social History   Occupational History  . Not on file  Tobacco  Use  . Smoking status: Never Smoker  . Smokeless tobacco: Never Used  Substance and Sexual Activity  . Alcohol use: No  . Drug use: No  . Sexual activity: Not Currently

## 2019-05-31 ENCOUNTER — Ambulatory Visit: Payer: Commercial Managed Care - PPO | Attending: Internal Medicine

## 2019-05-31 ENCOUNTER — Other Ambulatory Visit: Payer: Self-pay

## 2019-05-31 DIAGNOSIS — Z20822 Contact with and (suspected) exposure to covid-19: Secondary | ICD-10-CM

## 2019-06-03 LAB — NOVEL CORONAVIRUS, NAA

## 2019-06-06 ENCOUNTER — Ambulatory Visit: Payer: Commercial Managed Care - PPO | Attending: Internal Medicine

## 2019-06-06 DIAGNOSIS — Z20822 Contact with and (suspected) exposure to covid-19: Secondary | ICD-10-CM

## 2019-06-08 LAB — NOVEL CORONAVIRUS, NAA: SARS-CoV-2, NAA: NOT DETECTED

## 2019-06-21 ENCOUNTER — Telehealth: Payer: Self-pay | Admitting: Orthopedic Surgery

## 2019-06-21 NOTE — Telephone Encounter (Signed)
Katie Morgan called in to let Dr. Roda Shutters know that her shoulder injection lasted about a week, but now her pain is worse.  She states that he mentioned getting an MRI scan if her pain did not improve.  Does she need a follow up appointment first or can we just order the MRI?  Please advise.

## 2019-06-22 NOTE — Telephone Encounter (Signed)
Please make appt with Dr. Roda Shutters for patient. Thanks.

## 2019-06-22 NOTE — Telephone Encounter (Signed)
Follow up appointment first.

## 2019-06-29 NOTE — Telephone Encounter (Signed)
Scheduled pt an appt 07-03-2018 @3 :30 with Dr.Xu.

## 2019-07-04 ENCOUNTER — Encounter: Payer: Self-pay | Admitting: Orthopaedic Surgery

## 2019-07-04 ENCOUNTER — Other Ambulatory Visit: Payer: Self-pay

## 2019-07-04 ENCOUNTER — Ambulatory Visit: Payer: Commercial Managed Care - PPO | Admitting: Orthopaedic Surgery

## 2019-07-04 DIAGNOSIS — M25512 Pain in left shoulder: Secondary | ICD-10-CM | POA: Diagnosis not present

## 2019-07-04 DIAGNOSIS — G8929 Other chronic pain: Secondary | ICD-10-CM | POA: Diagnosis not present

## 2019-07-04 MED ORDER — TRAMADOL HCL 50 MG PO TABS: 50.0000 mg | ORAL_TABLET | Freq: Three times a day (TID) | ORAL | 0 refills | Status: DC | PRN

## 2019-07-04 NOTE — Progress Notes (Signed)
Office Visit Note   Patient: Katie Morgan           Date of Birth: 10-07-1985           MRN: [id number] Visit Date: 07/04/2019              Requested by: Avanell Shackleton, NP-C [address] [place],  [place] [address] PCP: Avanell Shackleton, NP-C   Assessment & Plan: Visit Diagnoses:  1. Chronic left shoulder pain     Plan: Impression is continued left shoulder pain questionable rotator cuff tendinitis versus biceps tendinitis.  At this point, we will obtain an MRI to evaluate for structural abnormalities.  She will follow-up with [place] once that is been completed.  Follow-Up Instructions: Return for after MRI.   Orders:  Orders Placed This Encounter  Procedures  . MR Shoulder Left w/o contrast   Meds ordered this encounter  Medications  . traMADol (ULTRAM) 50 MG tablet    Sig: Take 1 tablet (50 mg total) by mouth 3 (three) times daily as needed.    Dispense:  30 tablet    Refill:  0      Procedures: No procedures performed   Clinical Data: No additional findings.   Subjective: Chief Complaint  Patient presents with  . Left Shoulder - Pain, Follow-up    HPI patient is a pleasant 34 year old female who presents our clinic today with continued left shoulder pain.  She was seen in our office about 2 months ago for this.  She has had pain to the left shoulder for over 1 year.  She never had a specific injury.  Pain she has is to the top of the shoulder and radiates into the deltoid at times.  The pain is aggravated with any range of motion of the shoulder.  No weakness.  We injected her subacromial space about 2 months ago which provided mild relief of symptoms for about a week and a half.  Review of Systems as detailed in HPI.  All others reviewed and are negative.   Objective: Vital Signs: There were no vitals taken for this visit.  Physical Exam well-developed and well-nourished female in no acute distress.  Alert and oriented x3.  Ortho Exam examination  of her left shoulder reveals forward flexion to about 160 degrees.  She has near full external and internal range of motion.  Positive empty can test and positive speeds test.  Negative O'Brien's.  Full strength.  She is neurovascular intact distally.  Specialty Comments:  No specialty comments available.  Imaging: No new imaging   PMFS History: Patient Active Problem List   Diagnosis Date Noted  . Anemia   . Morbid obesity (HCC)   . Anemia, iron deficiency 05/20/2016  . Severe obesity (BMI >= 40) (HCC) 04/06/2016  . 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 Anemia   . Morbid obesity (HCC)     Family History  Problem Relation Age of Onset  . Obesity Mother   . Obesity Father   . Cancer Maternal Grandmother        Leu Gehrigs  . Breast cancer Maternal Aunt 50    History reviewed. No pertinent surgical history. Social History   Occupational History  . Not on file  Tobacco Use  . Smoking status: Never Smoker  . Smokeless tobacco: Never Used  Substance and Sexual Activity  . Alcohol use: No  . Drug use: No  .  Sexual activity: Not Currently

## 2019-07-11 ENCOUNTER — Ambulatory Visit: Payer: Commercial Managed Care - PPO | Attending: Internal Medicine

## 2019-07-11 DIAGNOSIS — Z20822 Contact with and (suspected) exposure to covid-19: Secondary | ICD-10-CM

## 2019-07-12 LAB — NOVEL CORONAVIRUS, NAA: SARS-CoV-2, NAA: NOT DETECTED

## 2019-08-03 ENCOUNTER — Other Ambulatory Visit: Payer: Self-pay | Admitting: Physician Assistant

## 2019-08-04 ENCOUNTER — Other Ambulatory Visit: Payer: Commercial Managed Care - PPO

## 2019-08-08 ENCOUNTER — Ambulatory Visit: Payer: Commercial Managed Care - PPO | Admitting: Orthopaedic Surgery

## 2019-08-08 ENCOUNTER — Ambulatory Visit
Admission: RE | Admit: 2019-08-08 | Discharge: 2019-08-08 | Disposition: A | Discharge status: Home / Self Care | Payer: Commercial Managed Care - PPO | Source: Ambulatory Visit | Attending: Physician Assistant | Admitting: Physician Assistant

## 2019-08-08 DIAGNOSIS — G8929 Other chronic pain: Secondary | ICD-10-CM

## 2019-08-09 NOTE — Progress Notes (Signed)
F/u when xu in office

## 2019-08-15 ENCOUNTER — Ambulatory Visit: Payer: Commercial Managed Care - PPO | Admitting: Orthopaedic Surgery

## 2019-08-15 ENCOUNTER — Encounter: Payer: Self-pay | Admitting: Orthopaedic Surgery

## 2019-08-15 ENCOUNTER — Other Ambulatory Visit: Payer: Self-pay

## 2019-08-15 DIAGNOSIS — M25512 Pain in left shoulder: Secondary | ICD-10-CM

## 2019-08-15 DIAGNOSIS — G8929 Other chronic pain: Secondary | ICD-10-CM | POA: Diagnosis not present

## 2019-08-15 NOTE — Progress Notes (Signed)
Office Visit Note   Patient: Katie Morgan           Date of Birth: 1985-10-23           MRN: [id number] Visit Date: 08/15/2019              Requested by: Girtha Rm, NP-C [place],  [place] [address] PCP: Girtha Rm, NP-C   Assessment & Plan: Visit Diagnoses:  1. 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  No full-thickness tears.  Type II acromion.  These findings were reviewed with the patient in detail and based on discussion she would like to be referred to Dr. Junius Roads for an ultrasound-guided left shoulder injection.  Overall this has been bothering her for about a year.  Fortunately at work she is no longer doing any heavy lifting or physical laboring.  She will follow-up if she does not get much relief from this injection and physical therapy.  We briefly discussed arthroscopic debridement and acromioplasty should she not receive much relief from the injection and physical therapy.  Follow-Up Instructions: Return for needs appt with Dr. Junius Roads for ultrasound guided left shoulder injection.   Orders:  Orders Placed This Encounter  Procedures  . Ambulatory referral to Physical Therapy   No orders of the defined types were placed in this encounter.     Procedures: No procedures performed   Clinical Data: No additional findings.   Subjective: Chief Complaint  Patient presents with  . Left Shoulder - Pain    Daje returns today for MRI review of the left shoulder.  Previous subacromial injection back in November 2020 gave her about 2 days of relief.  Medications have not given her any relief.   Review of Systems  Constitutional: Negative.   HENT: Negative.   Eyes: Negative.   Respiratory: Negative.   Cardiovascular: Negative.   Endocrine: Negative.   Musculoskeletal: Negative.   Neurological: Negative.   Hematological: Negative.    Psychiatric/Behavioral: Negative.   All other systems reviewed and are negative.    Objective: Vital Signs: There were no vitals taken for this visit.  Physical Exam Vitals and nursing note reviewed.  Constitutional:      Appearance: She is well-developed.  Pulmonary:     Effort: Pulmonary effort is normal.  Skin:    General: Skin is warm.     Capillary Refill: Capillary refill takes less than 2 seconds.  Neurological:     Mental Status: She is alert and oriented to person, place, and time.  Psychiatric:        Behavior: Behavior normal.        Thought Content: Thought content normal.        Judgment: Judgment normal.     Ortho Exam Left shoulder exam shows mild pain with elevation of the arm above her head.  Manual muscle testing is grossly normal. Specialty Comments:  No specialty comments available.  Imaging: No results found.   PMFS History: Patient Active Problem List   Diagnosis Date Noted  . Anemia   . Morbid obesity ([place])   . Anemia, iron deficiency 05/20/2016  . Severe obesity (BMI >= 40) ([place]) 04/06/2016  . 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 Anemia   . Morbid obesity ([place])     Family History  Problem Relation Age of Onset  . Obesity  Mother   . Obesity Father   . Cancer Maternal Grandmother        Leu Gehrigs  . Breast cancer Maternal Aunt 50    History reviewed. No pertinent surgical history. Social History   Occupational History  . Not on file  Tobacco Use  . Smoking status: Never Smoker  . Smokeless tobacco: Never Used  Substance and Sexual Activity  . Alcohol use: No  . Drug use: No  . Sexual activity: Not Currently

## 2019-08-18 ENCOUNTER — Ambulatory Visit: Payer: Commercial Managed Care - PPO | Admitting: Family Medicine

## 2019-08-18 ENCOUNTER — Ambulatory Visit: Payer: Self-pay

## 2019-08-18 ENCOUNTER — Other Ambulatory Visit: Payer: Self-pay

## 2019-08-18 ENCOUNTER — Encounter: Payer: Self-pay | Admitting: Family Medicine

## 2019-08-18 DIAGNOSIS — G8929 Other chronic pain: Secondary | ICD-10-CM

## 2019-08-18 DIAGNOSIS — M25512 Pain in left shoulder: Secondary | ICD-10-CM | POA: Diagnosis not present

## 2019-08-18 MED ORDER — HYDROCODONE-ACETAMINOPHEN 5-325 MG PO TABS: 1.0000 | ORAL_TABLET | Freq: Four times a day (QID) | ORAL | 0 refills | Status: DC | PRN

## 2019-08-18 NOTE — Progress Notes (Signed)
Subjective: Patient is here for ultrasound-guided intra-articular left glenohumeral injection.  Unfortunately she did not get any significant relief with subacromial injection.  Objective: Anterior pain with active range of motion.  Procedure: Ultrasound-guided left glenohumeral injection: After sterile prep with Betadine, injected 8 cc 1% lidocaine without epinephrine and 40 mg methylprednisolone using a 22-gauge spinal needle, passing the needle through approach into the glenohumeral joint.  Injectate was seen filling the joint capsule.  Good immediate relief.

## 2019-08-30 ENCOUNTER — Ambulatory Visit: Payer: Commercial Managed Care - PPO | Admitting: Physical Therapy

## 2019-08-30 ENCOUNTER — Encounter: Payer: Self-pay | Admitting: Physical Therapy

## 2019-08-30 ENCOUNTER — Other Ambulatory Visit: Payer: Self-pay

## 2019-08-30 DIAGNOSIS — M6281 Muscle weakness (generalized): Secondary | ICD-10-CM | POA: Diagnosis not present

## 2019-08-30 DIAGNOSIS — M25612 Stiffness of left shoulder, not elsewhere classified: Secondary | ICD-10-CM

## 2019-08-30 DIAGNOSIS — M25512 Pain in left shoulder: Secondary | ICD-10-CM | POA: Diagnosis not present

## 2019-08-30 DIAGNOSIS — M542 Cervicalgia: Secondary | ICD-10-CM

## 2019-08-30 DIAGNOSIS — G8929 Other chronic pain: Secondary | ICD-10-CM

## 2019-08-30 NOTE — Therapy (Signed)
[facility] Physical Therapy [address] [place], [place], [address] Phone: [phone]   Fax:  [phone]  Physical Therapy Evaluation  Patient Details  Name: Katie Morgan MRN: [id number] Date of Birth: 01-12-1986 Referring Provider (PT): Eunice Blase, MD   Encounter Date: 08/30/2019  PT End of Session - 08/30/19 1550    Visit Number  1    Number of Visits  8    Date for PT Re-Evaluation  10/25/19    PT Start Time  7989    PT Stop Time  1615    PT Time Calculation (min)  45 min    Activity Tolerance  Patient tolerated treatment well    Behavior During Therapy  [facility] for tasks assessed/performed       Past Medical History:  Diagnosis Date  . Anemia   . Morbid obesity ([place])     History reviewed. No pertinent surgical history.  There were no vitals filed for this visit.   Subjective Assessment - 08/30/19 1539    Subjective  Pt arriving to therapy reporting no pain currently in her L shoulder after having a shoulder injection. Pt reports having pain in the morning when she first gets out of bed due to sleeping wrong. Pt reporting waking up in the middle of the night with her L arm completely numb. Pt reporting her shoulder pain has been going on for years.    Limitations  House hold activities    Patient Stated Goals  Return to gym and be able to sleep without pain    Currently in Pain?  No/denies    Pain Location  Shoulder    Pain Orientation  Left    Pain Descriptors / Indicators  Aching;Numbness;Tingling;Other (Comment)    Pain Type  Chronic pain    Pain Onset  More than a month ago    Pain Frequency  Intermittent    Aggravating Factors   sleeping, reaching repeatively    Pain Relieving Factors  resting, changing positions    Effect of Pain on Daily Activities  unable to sleep comfortably         [facility] PT Assessment - 08/30/19 0001      Assessment   Medical Diagnosis  L shoulder pain    Referring Provider (PT)  Hilts, Michael, MD    Hand Dominance  Right    Prior Therapy  no      Precautions   Precautions  None      Restrictions   Weight Bearing Restrictions  No      Balance Screen   Has the patient fallen in the past 6 months  No    Is the patient reluctant to leave their home because of a fear of falling?   No      Home Film/video editor residence      Prior Function   Level of Independence  Independent    Vocation  Full time employment    Vocation Requirements  builds wings for [employer]   Overall Cognitive Status  Within Functional Limits for tasks assessed      Posture/Postural Control   Posture/Postural Control  Postural limitations    Postural Limitations  Rounded Shoulders;Forward head;Increased lumbar lordosis;Increased thoracic kyphosis      ROM / Strength   AROM / PROM / Strength  AROM;Strength      AROM   AROM Assessment Site  Shoulder    Right/Left Shoulder  Right;Left    Right Shoulder Extension  70 Degrees    Right Shoulder Flexion  165 Degrees    Right Shoulder ABduction  160 Degrees    Right Shoulder Internal Rotation  90 Degrees   abducted shoulder at 90 degrees   Right Shoulder External Rotation  90 Degrees    Left Shoulder Extension  65 Degrees    Left Shoulder Flexion  150 Degrees    Left Shoulder ABduction  160 Degrees    Left Shoulder Internal Rotation  90 Degrees    Left Shoulder External Rotation  75 Degrees   painful     Strength   Overall Strength  Deficits    Strength Assessment Site  Shoulder    Right/Left Shoulder  Right;Left    Right Shoulder Flexion  5/5    Right Shoulder Extension  5/5    Right Shoulder ABduction  5/5    Right Shoulder Internal Rotation  5/5    Right Shoulder External Rotation  5/5    Left Shoulder Flexion  4/5    Left Shoulder Extension  4/5    Left Shoulder ABduction  4/5    Left Shoulder Internal Rotation  4/5    Left Shoulder External Rotation  4/5      Palpation   Palpation comment  TTP:  anterior shoulder, supraspinatus       Special Tests    Special Tests  Rotator Cuff Impingement    Rotator Cuff Impingment tests  Empty Can test      Empty Can test   Findings  Positive    Side  Left      Ambulation/Gait   Gait Pattern  Within Functional Limits                Objective measurements completed on examination: See above findings.      [facility] Adult PT Treatment/Exercise - 08/30/19 0001      Exercises   Exercises  Shoulder      Shoulder Exercises: Supine   External Rotation  AAROM;Both;10 reps    Flexion  AAROM;Left;10 reps      Shoulder Exercises: Standing   Row  AROM;Strengthening;Both;10 reps    Other Standing Exercises  serratus punches using red theraband (Level 2)      Shoulder Exercises: Pulleys   Flexion  2 minutes      Shoulder Exercises: Stretch   Other Shoulder Stretches  upper trap stretch x 2 holding 10 seconds             PT Education - 08/30/19 1548    Education Details  PT POC, HEP    Person(s) Educated  Patient    Methods  Explanation;Demonstration;Other (comment)    Comprehension  Verbalized understanding;Returned demonstration          PT Long Term Goals - 08/30/19 1643      PT LONG TERM GOAL #1   Title  pt will be indepedent in her HEP and progression,    Time  6    Period  Weeks    Status  New      PT LONG TERM GOAL #2   Title  Pt will improve her L shoulder flexion to >/= 165 degrees.    Baseline  see flowsheets    Time  6    Period  Weeks    Status  New      PT LONG TERM GOAL #3   Title  Pt will report 50% improvement in sleeping.  Baseline  pt reporting waking up with pain and L arm completely numb    Time  6    Period  Weeks    Status  New      PT LONG TERM GOAL #4   Title  Pt will improve her L shoulder strength to >/=4+/5 in order to improve functional mobility and work related activities.    Time  6    Period  Weeks    Status  New             Plan - 08/30/19 1617    Clinical  Impression Statement  Pt presenting to therpay for evaluation of L shoulder pain. Pt dx with L rotator cuff tendionpathy. Pt still reporting difficulty sleeping with numbness reported at night and increased pain.  Pt with no pain at rest following injection 2 weeks ago. Pt was tender to palpation with active trigger points noted in L upper trap, mid trap and anterior deltoid. Pt with mild limitations in AROM and strength when compared to R UE. Pt could benefit from skilled PT to progress toward PLOF.    Examination-Activity Limitations  Carry;Reach Overhead;Sleep    Examination-Participation Restrictions  Other    Stability/Clinical Decision Making  Stable/Uncomplicated    Clinical Decision Making  Low    Rehab Potential  Good    PT Frequency  2x / week    PT Duration  6 weeks    PT Treatment/Interventions  ADLs/Self Care Home Management;Cryotherapy;Ultrasound;Traction;Moist Heat;Iontophoresis 4mg /ml Dexamethasone;Electrical Stimulation;Gait training;Stair training;Functional mobility training;Neuromuscular re-education;Therapeutic exercise;Therapeutic activities;Patient/family education;Manual techniques;Passive range of motion;Dry needling;Taping    PT Next Visit Plan  DN, shoulder ROM, mid trap stretch and levator stretch, postural exercises    PT Home Exercise Plan  [email or web address]    Consulted and Agree with Plan of Care  Patient       Patient will benefit from skilled therapeutic intervention in order to improve the following deficits and impairments:  Pain, Postural dysfunction, Decreased strength, Impaired UE functional use, Decreased range of motion  Visit Diagnosis: Chronic left shoulder pain  Muscle weakness (generalized)  Stiffness of left shoulder, not elsewhere classified  Cervicalgia     Problem List Patient Active Problem List   Diagnosis Date Noted  . Anemia   . Morbid obesity (HCC)   . Anemia, iron deficiency 05/20/2016  . Severe obesity (BMI >= 40) (HCC) 04/06/2016  .  Tinnitus of right ear 04/06/2016    04/08/2016, PT 08/30/2019, 4:45 PM  [facility] Physical Therapy [address] [place], [place], [place] Phone: [phone]   Fax:  [phone]  Name: MYLA MAURIELLO MRN: Illene Bolus Date of Birth: [birth year]

## 2019-08-30 NOTE — Patient Instructions (Signed)
Access Code: [email or web address] URL: [email or web address] Date: 08/30/2019 Prepared by: Narda Amber  Exercises Supine Shoulder Flexion Extension AAROM with Dowel - 2-3 x daily - 7 x weekly - 2 sets - 10 reps Standing Serratus Punch with Resistance - 2 x daily - 7 x weekly - 2 sets - 5 reps Supine Shoulder External Rotation with Dowel - 2 x daily - 7 x weekly - 2 sets - 10 reps - 3-5 seconds hold Standing Shoulder Row with Anchored Resistance - 2 x daily - 7 x weekly - 3 sets - 10 reps Seated Gentle Upper Trapezius Stretch - 2 x daily - 7 x weekly - 3 reps - 5-10 seconds hold

## 2019-09-08 ENCOUNTER — Encounter: Payer: Self-pay | Admitting: Physical Therapy

## 2019-09-08 ENCOUNTER — Other Ambulatory Visit: Payer: Self-pay

## 2019-09-08 ENCOUNTER — Ambulatory Visit: Payer: Commercial Managed Care - PPO | Admitting: Physical Therapy

## 2019-09-08 DIAGNOSIS — M542 Cervicalgia: Secondary | ICD-10-CM

## 2019-09-08 DIAGNOSIS — G8929 Other chronic pain: Secondary | ICD-10-CM

## 2019-09-08 DIAGNOSIS — M25612 Stiffness of left shoulder, not elsewhere classified: Secondary | ICD-10-CM

## 2019-09-08 DIAGNOSIS — M6281 Muscle weakness (generalized): Secondary | ICD-10-CM | POA: Diagnosis not present

## 2019-09-08 DIAGNOSIS — M25512 Pain in left shoulder: Secondary | ICD-10-CM

## 2019-09-08 NOTE — Therapy (Signed)
[facility] Physical Therapy [address] [place], [place], [address] Phone: [phone]   Fax:  [phone]  Physical Therapy Treatment  Patient Details  Name: Katie Morgan MRN: [id number] Date of Birth: [birth year] Referring Provider (PT): Lavada Mesi, MD   Encounter Date: 09/08/2019  PT End of Session - 09/08/19 1523    Visit Number  2    Number of Visits  8    Date for PT Re-Evaluation  10/25/19    PT Start Time  1515    PT Stop Time  1553    PT Time Calculation (min)  38 min    Activity Tolerance  Patient tolerated treatment well    Behavior During Therapy  [facility] for tasks assessed/performed       Past Medical History:  Diagnosis Date  . Anemia   . Morbid obesity (HCC)     History reviewed. No pertinent surgical history.  There were no vitals filed for this visit.  Subjective Assessment - 09/08/19 1522    Subjective  Patient reports her shoulder is not bad. She is consistent with exercises at home. She reports sleeping is getting better.    Patient Stated Goals  Return to gym and be able to sleep without pain    Currently in Pain?  No/denies         [facility] PT Assessment - 09/08/19 0001      AROM   Overall AROM Comments  Patient exhibits full shoulder elevation AROM equal bilaterally                   [facility] Adult PT Treatment/Exercise - 09/08/19 0001      Exercises   Exercises  Shoulder      Shoulder Exercises: Standing   External Rotation  15 reps   2 sets   Theraband Level (Shoulder External Rotation)  Level 2 (Red)    Internal Rotation  15 reps   2 sets   Theraband Level (Shoulder Internal Rotation)  Level 2 (Red)    Flexion  15 reps   2 sets   Shoulder Flexion Weight (lbs)  2    Flexion Limitations  scaption    Extension  12 reps   2 sets   Theraband Level (Shoulder Extension)  Level 2 (Red)    Row  15 reps   2 sets   Theraband Level (Shoulder Row)  Level 2 (Red)    Other Standing Exercises  Wall ball  circles at 90 deg flexion cw/ccw 2x10 each   red swiss ball   Other Standing Exercises  Push-up plus from table 2x10      Shoulder Exercises: ROM/Strengthening   UBE (Upper Arm Bike)  L3 x 5 min (fwd/bwd)      Shoulder Exercises: Stretch   Other Shoulder Stretches  Upper trap and levator stretch 2x15 sec each             PT Education - 09/08/19 1522    Education Details  HEP    Person(s) Educated  Patient    Methods  Explanation;Demonstration;Verbal cues    Comprehension  Verbalized understanding;Returned demonstration;Verbal cues required;Need further instruction          PT Long Term Goals - 08/30/19 1643      PT LONG TERM GOAL #1   Title  pt will be indepedent in her HEP and progression,    Time  6    Period  Weeks    Status  New  PT LONG TERM GOAL #2   Title  Pt will improve her L shoulder flexion to >/= 165 degrees.    Baseline  see flowsheets    Time  6    Period  Weeks    Status  New      PT LONG TERM GOAL #3   Title  Pt will report 50% improvement in sleeping.    Baseline  pt reporting waking up with pain and L arm completely numb    Time  6    Period  Weeks    Status  New      PT LONG TERM GOAL #4   Title  Pt will improve her L shoulder strength to >/=4+/5 in order to improve functional mobility and work related activities.    Time  6    Period  Weeks    Status  New            Plan - 09/08/19 1523    Clinical Impression Statement  Patient progressing well with her exercises and seems to be improving. She exhibits improved active motion and tolerated progression in strengthening well. She did require cueing to avoid scapular shrug with exercises. She exhibited fatigue of the left greater than right. She would benefit from continued skilled PT to progress motion and strength to reduce pain with activity and improve sleeping.    PT Treatment/Interventions  ADLs/Self Care Home Management;Cryotherapy;Ultrasound;Traction;Moist  Heat;Iontophoresis 4mg /ml Dexamethasone;Electrical Stimulation;Gait training;Stair training;Functional mobility training;Neuromuscular re-education;Therapeutic exercise;Therapeutic activities;Patient/family education;Manual techniques;Passive range of motion;Dry needling;Taping    PT Next Visit Plan  DN PRN, shoulder ROM and stretching, postural strengthening exercises    PT Home Exercise Plan  ZHMVHCCA    Consulted and Agree with Plan of Care  Patient       Patient will benefit from skilled therapeutic intervention in order to improve the following deficits and impairments:  Pain, Postural dysfunction, Decreased strength, Impaired UE functional use, Decreased range of motion  Visit Diagnosis: Chronic left shoulder pain  Muscle weakness (generalized)  Stiffness of left shoulder, not elsewhere classified  Cervicalgia     Problem List Patient Active Problem List   Diagnosis Date Noted  . Anemia   . Morbid obesity ([place])   . Anemia, iron deficiency 05/20/2016  . Severe obesity (BMI >= 40) ([place]) 04/06/2016  . Tinnitus of right ear 04/06/2016    Hilda Blades, PT, DPT, LAT, ATC 09/08/19  4:02 PM Phone: [phone] Fax: [facility] [address] [place], [place], [address] Phone: [phone]   Fax:  [phone]  Name: Katie Morgan MRN: [id number] Date of Birth: Oct 07, 1985

## 2019-09-13 ENCOUNTER — Encounter: Payer: Commercial Managed Care - PPO | Admitting: Rehabilitative and Restorative Service Providers"

## 2019-09-13 ENCOUNTER — Telehealth: Payer: Self-pay | Admitting: Rehabilitative and Restorative Service Providers"

## 2019-09-13 NOTE — Telephone Encounter (Signed)
Called patient after 15 mins no show for appointment.  Pt. Answered and reported forgetting appointment and ended up scheduling for Monday April 5.  Chyrel Masson, PT, DPT, OCS, ATC 09/13/19  3:33 PM

## 2019-09-18 ENCOUNTER — Ambulatory Visit: Payer: Commercial Managed Care - PPO | Admitting: Physical Therapy

## 2019-09-18 ENCOUNTER — Encounter: Payer: Self-pay | Admitting: Physical Therapy

## 2019-09-18 ENCOUNTER — Other Ambulatory Visit: Payer: Self-pay

## 2019-09-18 DIAGNOSIS — G8929 Other chronic pain: Secondary | ICD-10-CM

## 2019-09-18 DIAGNOSIS — M25512 Pain in left shoulder: Secondary | ICD-10-CM

## 2019-09-18 DIAGNOSIS — M542 Cervicalgia: Secondary | ICD-10-CM | POA: Diagnosis not present

## 2019-09-18 DIAGNOSIS — M25612 Stiffness of left shoulder, not elsewhere classified: Secondary | ICD-10-CM

## 2019-09-18 DIAGNOSIS — M6281 Muscle weakness (generalized): Secondary | ICD-10-CM

## 2019-09-18 NOTE — Therapy (Signed)
[facility] Physical Therapy [address] [place], [place], [address] Phone: [phone]   Fax:  [phone]  Physical Therapy Treatment  Patient Details  Name: Katie Morgan MRN: [id number] Date of Birth: [birth year] Referring Provider (PT): Lavada Mesi, MD   Encounter Date: 09/18/2019  PT End of Session - 09/18/19 1524    Visit Number  3    Number of Visits  8    Date for PT Re-Evaluation  10/25/19    PT Start Time  1440    PT Stop Time  1518    PT Time Calculation (min)  38 min    Activity Tolerance  Patient tolerated treatment well    Behavior During Therapy  [facility] for tasks assessed/performed       Past Medical History:  Diagnosis Date  . Anemia   . Morbid obesity (HCC)     History reviewed. No pertinent surgical history.  There were no vitals filed for this visit.  Subjective Assessment - 09/18/19 1443    Subjective  doing well shoulder isn't hurting at all. had a little pain over the weekend but that subsided.    Patient Stated Goals  Return to gym and be able to sleep without pain    Currently in Pain?  No/denies                       [facility] Adult PT Treatment/Exercise - 09/18/19 1444      Shoulder Exercises: Standing   External Rotation  20 reps;Theraband    Theraband Level (Shoulder External Rotation)  Level 3 (Green)    Flexion  20 reps;Weights    Shoulder Flexion Weight (lbs)  2    ABduction  Both;20 reps;Weights    Shoulder ABduction Weight (lbs)  2    Row  20 reps;Theraband    Theraband Level (Shoulder Row)  Level 3 (Green)    Other Standing Exercises  Wall ball circles at 90 deg flexion cw/ccw 2x10 each   2# med ball   Other Standing Exercises  overhead press 2x10; 1# bil; bent over row 2x10 2#; bent over flys 1/2# 2x10      Shoulder Exercises: ROM/Strengthening   UBE (Upper Arm Bike)  L3 x 5 min (fwd/bwd)                  PT Long Term Goals - 08/30/19 1643      PT LONG TERM GOAL #1   Title   pt will be indepedent in her HEP and progression,    Time  6    Period  Weeks    Status  New      PT LONG TERM GOAL #2   Title  Pt will improve her L shoulder flexion to >/= 165 degrees.    Baseline  see flowsheets    Time  6    Period  Weeks    Status  New      PT LONG TERM GOAL #3   Title  Pt will report 50% improvement in sleeping.    Baseline  pt reporting waking up with pain and L arm completely numb    Time  6    Period  Weeks    Status  New      PT LONG TERM GOAL #4   Title  Pt will improve her L shoulder strength to >/=4+/5 in order to improve functional mobility and work related activities.    Time  6  Period  Weeks    Status  New            Plan - 09/18/19 1524    Clinical Impression Statement  Pt reporting improvement in symptoms, pain and ROM since starting PT.  Feels like she may be ready to d/c next week with comprehensive HEP.  Plan to see how she's doing and d/c vs continue.    PT Treatment/Interventions  ADLs/Self Care Home Management;Cryotherapy;Ultrasound;Traction;Moist Heat;Iontophoresis 4mg /ml Dexamethasone;Electrical Stimulation;Gait training;Stair training;Functional mobility training;Neuromuscular re-education;Therapeutic exercise;Therapeutic activities;Patient/family education;Manual techniques;Passive range of motion;Dry needling;Taping    PT Next Visit Plan  see how she's doing, d/c or continue?    PT Home Exercise Plan  [email or web address]    Consulted and Agree with Plan of Care  Patient       Patient will benefit from skilled therapeutic intervention in order to improve the following deficits and impairments:  Pain, Postural dysfunction, Decreased strength, Impaired UE functional use, Decreased range of motion  Visit Diagnosis: Chronic left shoulder pain  Muscle weakness (generalized)  Stiffness of left shoulder, not elsewhere classified  Cervicalgia     Problem List Patient Active Problem List   Diagnosis Date Noted  . Anemia   . Morbid  obesity ([place])   . Anemia, iron deficiency 05/20/2016  . Severe obesity (BMI >= 40) ([place]) 04/06/2016  . Tinnitus of right ear 04/06/2016      Laureen Abrahams, PT, DPT 09/18/19 3:26 PM     [facility] Physical Therapy [address] [place], [place], [address] Phone: [phone]   Fax:  [phone]  Name: KRISA BLATTNER MRN: [id number] Date of Birth: [birth year]

## 2019-09-20 ENCOUNTER — Encounter: Payer: Self-pay | Admitting: Family Medicine

## 2019-09-22 ENCOUNTER — Encounter: Payer: Commercial Managed Care - PPO | Admitting: Rehabilitative and Restorative Service Providers"

## 2019-09-26 ENCOUNTER — Other Ambulatory Visit: Payer: Self-pay

## 2019-09-26 ENCOUNTER — Ambulatory Visit: Payer: Commercial Managed Care - PPO | Admitting: Physical Therapy

## 2019-09-26 DIAGNOSIS — M6281 Muscle weakness (generalized): Secondary | ICD-10-CM

## 2019-09-26 DIAGNOSIS — M25512 Pain in left shoulder: Secondary | ICD-10-CM

## 2019-09-26 DIAGNOSIS — M542 Cervicalgia: Secondary | ICD-10-CM | POA: Diagnosis not present

## 2019-09-26 DIAGNOSIS — G8929 Other chronic pain: Secondary | ICD-10-CM

## 2019-09-26 DIAGNOSIS — M25612 Stiffness of left shoulder, not elsewhere classified: Secondary | ICD-10-CM | POA: Diagnosis not present

## 2019-09-26 NOTE — Therapy (Signed)
[facility] Physical Therapy [address] [place], [place], [address] Phone: [phone]   Fax:  [phone]  Physical Therapy Treatment Discharge summary   Patient Details  Name: Katie Morgan MRN: [id number] Date of Birth: 30-Oct-1985 Referring Provider (PT): Eunice Blase MD   Encounter Date: 09/26/2019  PT End of Session - 09/26/19 1148    Visit Number  4    Number of Visits  8    Date for PT Re-Evaluation  10/25/19    PT Start Time  1100    PT Stop Time  1135    PT Time Calculation (min)  35 min    Activity Tolerance  Patient tolerated treatment well    Behavior During Therapy  [facility] for tasks assessed/performed       Past Medical History:  Diagnosis Date  . Anemia   . Morbid obesity ([place])     No past surgical history on file.  There were no vitals filed for this visit.      [facility] PT Assessment - 09/26/19 0001      Assessment   Medical Diagnosis  L shoulder pain    Referring Provider (PT)  Hilts, Michael MD      AROM   Right Shoulder Extension  70 Degrees    Right Shoulder Flexion  168 Degrees    Right Shoulder ABduction  162 Degrees    Right Shoulder Internal Rotation  90 Degrees    Right Shoulder External Rotation  90 Degrees    Left Shoulder Extension  70 Degrees    Left Shoulder Flexion  165 Degrees    Left Shoulder ABduction  164 Degrees    Left Shoulder Internal Rotation  90 Degrees   shoulder at 90 abd   Left Shoulder External Rotation  80 Degrees      Strength   Left Shoulder Flexion  4+/5    Left Shoulder Extension  4+/5    Left Shoulder ABduction  4+/5    Left Shoulder Internal Rotation  4+/5    Left Shoulder External Rotation  4+/5                   [facility] Adult PT Treatment/Exercise - 09/26/19 0001      Shoulder Exercises: Standing   External Rotation  20 reps;Theraband    Theraband Level (Shoulder External Rotation)  Level 3 (Green)    Flexion  20 reps;Weights    ABduction  Both;20 reps;Weights     Row  20 reps;Theraband    Theraband Level (Shoulder Row)  Level 3 (Green)    Other Standing Exercises  standing flexion, D1 and D2 diagnals x 10 reps each using green theraband. Pt reporting mild fatigue in left sholder    Other Standing Exercises  wall push ups x 10,       Shoulder Exercises: Stretch   Other Shoulder Stretches  Door stretch: 90/90 position, rhomboid stretch, pect minor stretch all x2 holding 30 seconds                  PT Long Term Goals - 09/26/19 1125      PT LONG TERM GOAL #1   Title  pt will be indepedent in her HEP and progression,    Time  6    Period  Weeks    Status  Achieved      PT LONG TERM GOAL #2   Title  Pt will improve her L shoulder flexion to >/= 165 degrees.  Baseline  165 degrees    Time  6    Period  Weeks    Status  Achieved      PT LONG TERM GOAL #3   Title  Pt will report 50% improvement in sleeping.    Baseline  Pt reporting she is no longer waking up in the middle of the night for pain.    Time  6    Period  Weeks    Status  Achieved      PT LONG TERM GOAL #4   Title  Pt will improve her L shoulder strength to >/=4+/5 in order to improve functional mobility and work related activities.    Baseline  L shoulder flexion 4+/5    Time  6    Period  Weeks    Status  Achieved            Plan - 09/26/19 1113    Clinical Impression Statement  Pt arriving to therpay reporting no pain in her left shoulder. All HEP were reviewed and new ones added for stretching and Diagonal patterns. Pt was issued green and blue theraband to progress her resistance as tolerated. Pt's AROM has improved to 70 degrees extension, 165 degrees flexion, 90 degrees IR and 80 degrees ER. Pt's strength grossly 4+5 which has improved since pt's evaluation. Pt has met all goals set and is being discharged from PT.    Examination-Activity Limitations  Carry;Reach Overhead;Sleep    Examination-Participation Restrictions  Other    Stability/Clinical  Decision Making  Stable/Uncomplicated    Rehab Potential  Good    PT Treatment/Interventions  ADLs/Self Care Home Management;Cryotherapy;Ultrasound;Traction;Moist Heat;Iontophoresis 64m/ml Dexamethasone;Electrical Stimulation;Gait training;Stair training;Functional mobility training;Neuromuscular re-education;Therapeutic exercise;Therapeutic activities;Patient/family education;Manual techniques;Passive range of motion;Dry needling;Taping    PT Next Visit Plan  Pt discharged from PT 09/26/2019    PT [place] Agree with Plan of Care  Patient       Patient will benefit from skilled therapeutic intervention in order to improve the following deficits and impairments:  Pain, Postural dysfunction, Decreased strength, Impaired UE functional use, Decreased range of motion  Visit Diagnosis: Chronic left shoulder pain  Muscle weakness (generalized)  Stiffness of left shoulder, not elsewhere classified  Cervicalgia  PHYSICAL THERAPY DISCHARGE SUMMARY  Visits from Start of Care: 4  Current functional level related to goals / functional outcomes: All goals met   Remaining deficits: Mild strength deficit of 4+/5 compared to 5/5 on R shoulder   Education / Equipment: HEP, ice/heat Plan: Patient agrees to discharge.  Patient goals were met. Patient is being discharged due to meeting the stated rehab goals.  ?????       Problem List Patient Active Problem List   Diagnosis Date Noted  . Anemia   . Morbid obesity ([place]   . Anemia, iron deficiency 05/20/2016  . Severe [place] (BMI >= 40) ([place] 04/06/2016  . Tinnitus of right ear 04/06/2016    JOretha Caprice MPT 09/26/2019, 11:51 AM  [facility] [address] [address]Phone: [phone]  Fax:  [phone] Name: Katie MCCLISHMRN: [id number] of Birth: [birth year]

## 2019-09-29 ENCOUNTER — Encounter: Payer: Commercial Managed Care - PPO | Admitting: Rehabilitative and Restorative Service Providers"

## 2020-07-28 IMAGING — MR MR SHOULDER*L* W/O CM
4 of 6 series · 17 of 40 positions shown · non-contrast
Comparison: None.

CLINICAL DATA: Chronic left shoulder pain, popping, weakness and
decreased range of motion. No known injury.

EXAM:
MRI OF THE LEFT SHOULDER WITHOUT CONTRAST
TECHNIQUE: Multiplanar, multisequence MR imaging of the shoulder was performed.
No intravenous contrast was administered.

[Series 6: PD fat-sat · axial · left · 4.0mm · 0.44mm/px · z∈[-36,+53]mm · 5 of 20 slices shown (1 of 3)]
[im 1/20]
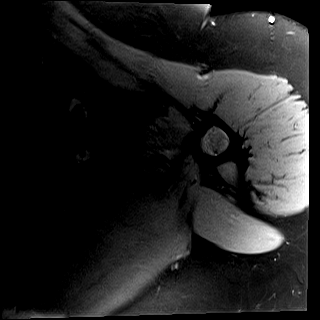
[im 5/20]
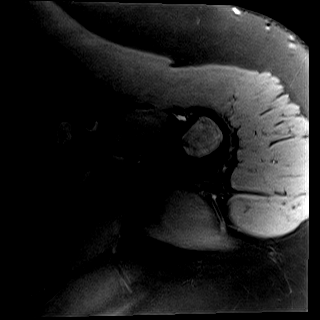
[im 10/20]
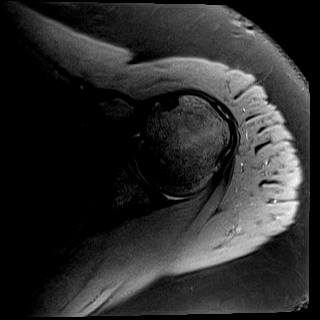
[im 15/20]
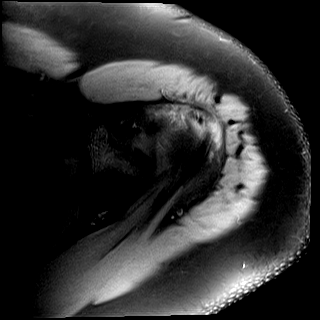
[im 20/20]
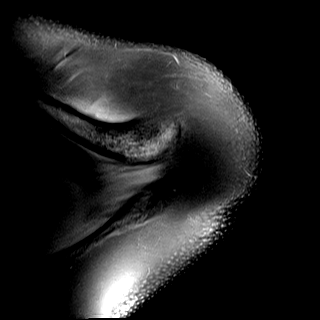

[Series 7: T2 fat-sat · oblique · left · 4.0mm · 0.22mm/px · 3 of 21 slices shown]
[im 4/21]
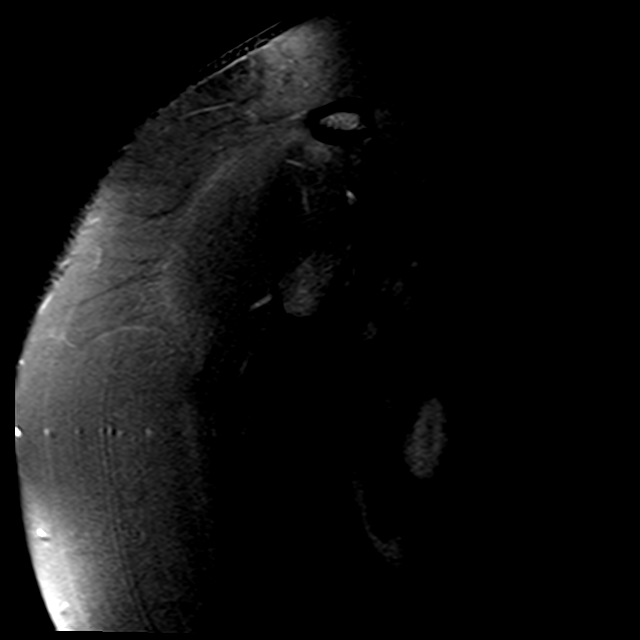
[im 11/21]
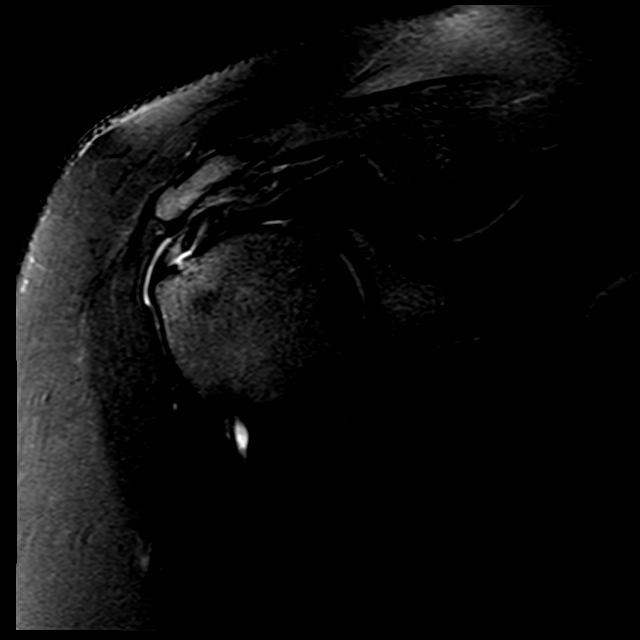
[im 17/21]
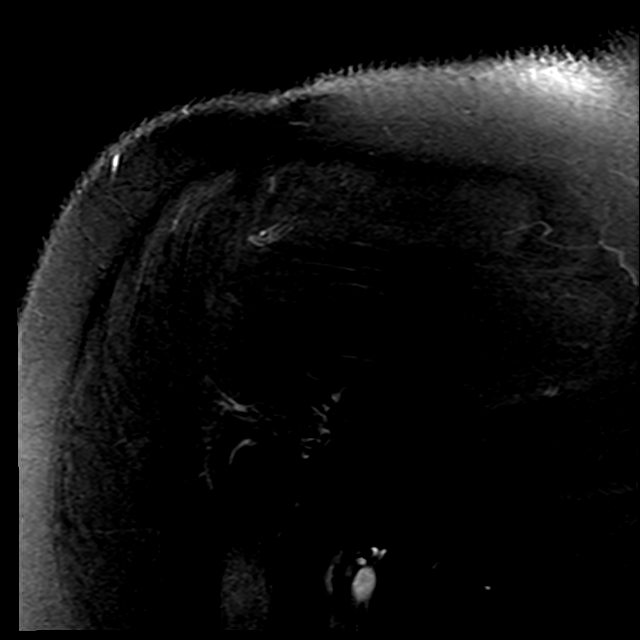

[Series 8: PD fat-sat · oblique · left · 4.0mm · 0.22mm/px · 6 of 21 slices shown (2 of 3)]
[im 1/21]
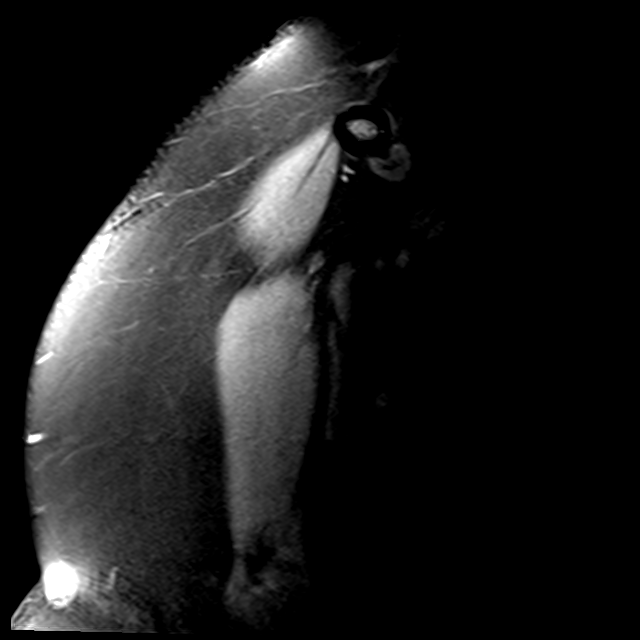
[im 4/21]
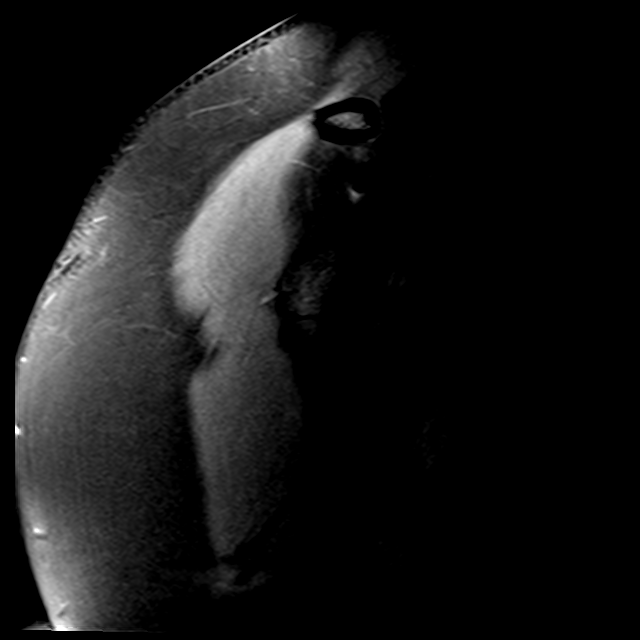
[im 7/21]
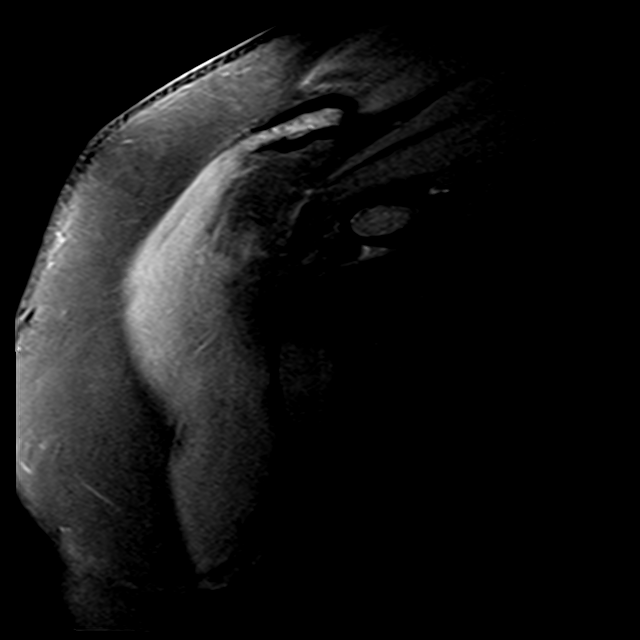
[im 11/21]
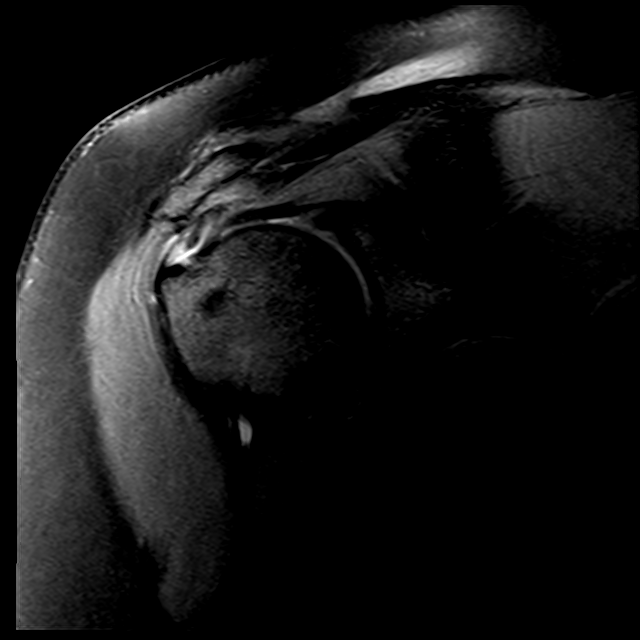
[im 14/21]
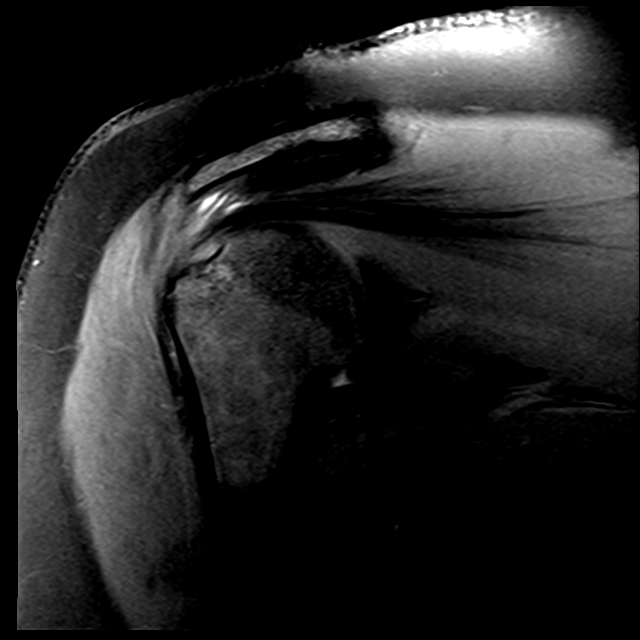
[im 17/21]
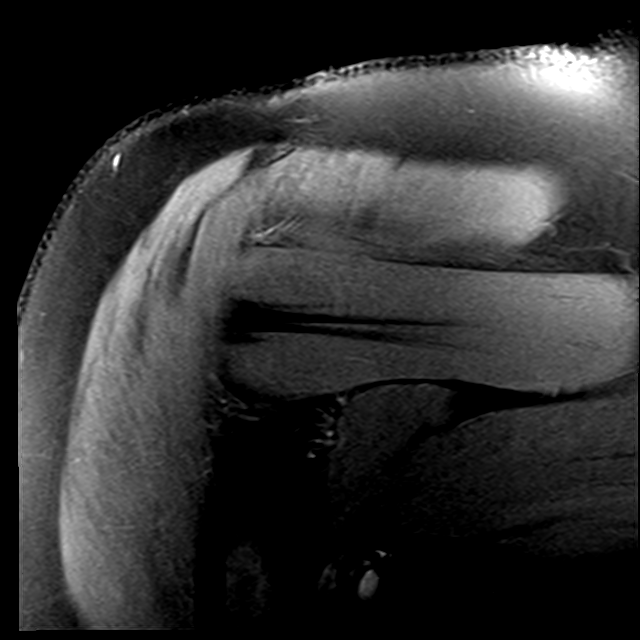

[Series 11: PD fat-sat · oblique · left · 4.0mm · 0.22mm/px · 3 of 21 slices shown (3 of 3)]
[im 4/21]
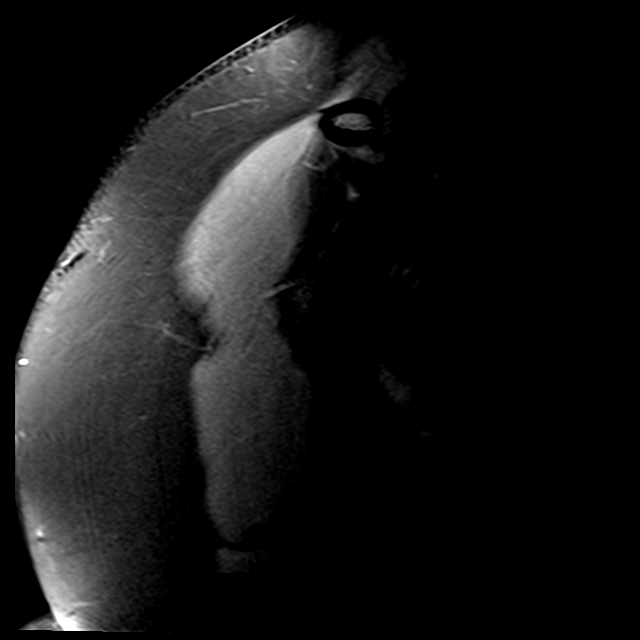
[im 11/21]
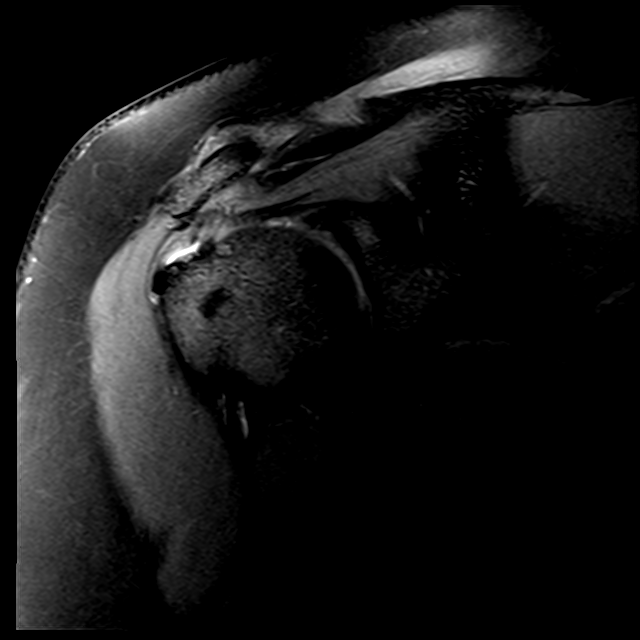
[im 17/21]
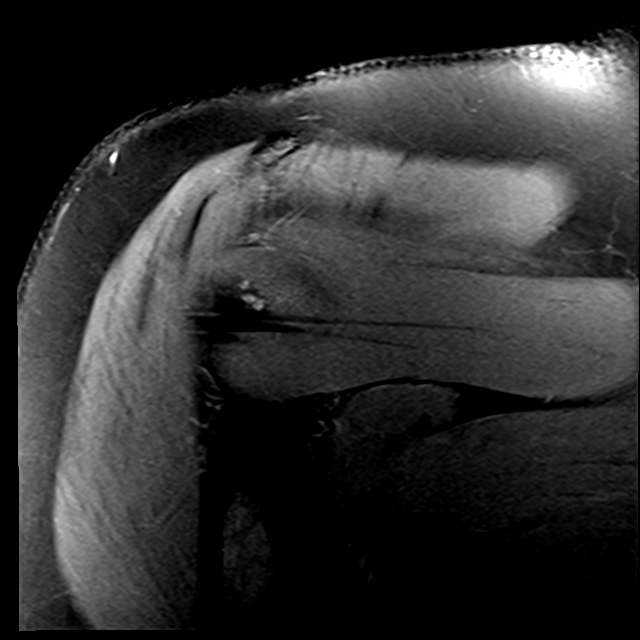

[17 of 40 positions shown; findings below may reference images not displayed]

FINDINGS: Rotator cuff: Rotator cuff tendinopathy is worst in the
supraspinatus. There is an interstitial tear of the supraspinatus at
the greater tuberosity measuring 0.8 cm from front to back. No
tendon retraction. The rotator cuff is otherwise intact.

Muscles:  Normal without atrophy or focal lesion.

Biceps long head:  Intact and normal appearance.

Acromioclavicular Joint: Appears normal. Type 2 acromion. The
acromion is downsloping with some subacromial spurring. Small volume
of fluid is present in the subacromial/subdeltoid bursa.

Glenohumeral Joint: Appears normal.

Labrum:  Intact.

Bones:  No fracture or focal lesion.

Other: None.
IMPRESSION: Rotator cuff tendinopathy is worst in the supraspinatus where there
is a 0.8 cm interstitial tear without retraction or atrophy.

Type 2 acromion is downsloping with some subacromial spurring.

Small volume of fluid in the subacromial/subdeltoid bursa compatible
with bursitis.

## 2020-09-03 ENCOUNTER — Ambulatory Visit: Payer: Commercial Managed Care - PPO | Admitting: Family Medicine

## 2020-09-03 ENCOUNTER — Encounter: Payer: Self-pay | Admitting: Family Medicine

## 2020-09-03 ENCOUNTER — Other Ambulatory Visit: Payer: Self-pay

## 2020-09-03 VITALS — BP 130/80 | HR 80 | Ht 66.0 in | Wt 267.0 lb

## 2020-09-03 DIAGNOSIS — D509 Iron deficiency anemia, unspecified: Secondary | ICD-10-CM

## 2020-09-03 DIAGNOSIS — Z113 Encounter for screening for infections with a predominantly sexual mode of transmission: Secondary | ICD-10-CM | POA: Diagnosis not present

## 2020-09-03 DIAGNOSIS — N898 Other specified noninflammatory disorders of vagina: Secondary | ICD-10-CM | POA: Diagnosis not present

## 2020-09-03 DIAGNOSIS — Z0000 Encounter for general adult medical examination without abnormal findings: Secondary | ICD-10-CM | POA: Diagnosis not present

## 2020-09-03 DIAGNOSIS — R5383 Other fatigue: Secondary | ICD-10-CM

## 2020-09-03 LAB — CBC WITH DIFFERENTIAL/PLATELET
Basophils Absolute: 0.1 10*3/uL (ref 0.0–0.2)
Basos: 0 %
EOS (ABSOLUTE): 0.1 10*3/uL (ref 0.0–0.4)
Immature Grans (Abs): 0 10*3/uL (ref 0.0–0.1)
MCHC: 30 g/dL — ABNORMAL LOW (ref 31.5–35.7)
Neutrophils Absolute: 7.4 10*3/uL — ABNORMAL HIGH (ref 1.4–7.0)
RBC: 4.58 x10E6/uL (ref 3.77–5.28)
RDW: 16.7 % — ABNORMAL HIGH (ref 11.7–15.4)

## 2020-09-03 NOTE — Patient Instructions (Signed)
Preventive Care 21-37 Years Old, Female Preventive care refers to lifestyle choices and visits with your health care provider that can promote health and wellness. This includes:  A yearly physical exam. This is also called an annual wellness visit.  Regular dental and eye exams.  Immunizations.  Screening for certain conditions.  Healthy lifestyle choices, such as: ? Eating a healthy diet. ? Getting regular exercise. ? Not using drugs or products that contain nicotine and tobacco. ? Limiting alcohol use. What can I expect for my preventive care visit? Physical exam Your health care provider may check your:  Height and weight. These may be used to calculate your BMI (body mass index). BMI is a measurement that tells if you are at a healthy weight.  Heart rate and blood pressure.  Body temperature.  Skin for abnormal spots. Counseling Your health care provider may ask you questions about your:  Past medical problems.  Family's medical history.  Alcohol, tobacco, and drug use.  Emotional well-being.  Home life and relationship well-being.  Sexual activity.  Diet, exercise, and sleep habits.  Work and work environment.  Access to firearms.  Method of birth control.  Menstrual cycle.  Pregnancy history. What immunizations do I need? Vaccines are usually given at various ages, according to a schedule. Your health care provider will recommend vaccines for you based on your age, medical history, and lifestyle or other factors, such as travel or where you work.   What tests do I need? Blood tests  Lipid and cholesterol levels. These may be checked every 5 years starting at age 20.  Hepatitis C test.  Hepatitis B test. Screening  Diabetes screening. This is done by checking your blood sugar (glucose) after you have not eaten for a while (fasting).  STD (sexually transmitted disease) testing, if you are at risk.  BRCA-related cancer screening. This may be  done if you have a family history of breast, ovarian, tubal, or peritoneal cancers.  Pelvic exam and Pap test. This may be done every 3 years starting at age 21. Starting at age 30, this may be done every 5 years if you have a Pap test in combination with an HPV test. Talk with your health care provider about your test results, treatment options, and if necessary, the need for more tests.   Follow these instructions at home: Eating and drinking  Eat a healthy diet that includes fresh fruits and vegetables, whole grains, lean protein, and low-fat dairy products.  Take vitamin and mineral supplements as recommended by your health care provider.  Do not drink alcohol if: ? Your health care provider tells you not to drink. ? You are pregnant, may be pregnant, or are planning to become pregnant.  If you drink alcohol: ? Limit how much you have to 0-1 drink a day. ? Be aware of how much alcohol is in your drink. In the [place] one drink equals one 12 oz bottle of beer (355 mL), one 5 oz glass of wine (148 mL), or one 1 oz glass of hard liquor (44 mL).   Lifestyle  Take daily care of your teeth and gums. Brush your teeth every morning and night with fluoride toothpaste. Floss one time each day.  Stay active. Exercise for at least 30 minutes 5 or more days each week.  Do not use any products that contain nicotine or tobacco, such as cigarettes, e-cigarettes, and chewing tobacco. If you need help quitting, ask your health care provider.  Do not   use drugs.  If you are sexually active, practice safe sex. Use a condom or other form of protection to prevent STIs (sexually transmitted infections).  If you do not wish to become pregnant, use a form of birth control. If you plan to become pregnant, see your health care provider for a prepregnancy visit.  Find healthy ways to cope with stress, such as: ? Meditation, yoga, or listening to music. ? Journaling. ? Talking to a trusted  person. ? Spending time with friends and family. Safety  Always wear your seat belt while driving or riding in a vehicle.  Do not drive: ? If you have been drinking alcohol. Do not ride with someone who has been drinking. ? When you are tired or distracted. ? While texting.  Wear a helmet and other protective equipment during sports activities.  If you have firearms in your house, make sure you follow all gun safety procedures.  Seek help if you have been physically or sexually abused. What's next?  Go to your health care provider once a year for an annual wellness visit.  Ask your health care provider how often you should have your eyes and teeth checked.  Stay up to date on all vaccines. This information is not intended to replace advice given to you by your health care provider. Make sure you discuss any questions you have with your health care provider. Document Revised: 01/28/2020 Document Reviewed: 02/10/2018 Elsevier Patient Education  2021 [employer]vier Inc.  

## 2020-09-03 NOTE — Progress Notes (Signed)
Subjective:    Patient ID: Katie Morgan, female    DOB: 26-Feb-1986, 35 y.o.   MRN: [id number]  HPI Chief Complaint  Patient presents with  . fasting cpe    Fasting cpe, would like STD. Would like pelvic exam. Last pap was 2020 here   She is new to the practice and here for a complete physical exam. Previous medical care: Last CPE: 07/2018  Other providers: Orthopedist- Dr. Prince Rome   Breast cancer in maternal aunt and cousin.   Anemia in the past. Does not take iron regularly. States she tried to give blood last year and was told her Hgb was 7 so she could not give blood.  She does note being tired.    Requests STD screening. Denies vaginal discharge but has some irritation x 2 weeks. No odor, itching.    Social history: Lives alone, works for [employer]  Denies smoking, drinking alcohol, drug use  Diet: gave up sodas.  Excerise: walks 2 times per week for an hour   Immunizations: Covid vaccines - 2 received.   Health maintenance:  Mammogram: N/A Colonoscopy: N/A Last Gynecological Exam: Last Menstrual cycle: due today  Pregnancies: 0 Last Dental Exam: last week  Last Eye Exam: November 2021   Wears seatbelt always, smoke detectors in home and functioning, does not text while driving and feels safe in home environment.   Reviewed allergies, medications, past medical, surgical, family, and social history.     Review of Systems Review of Systems Constitutional: -fever, -chills, -sweats, -unexpected weight change,+fatigue ENT: -runny nose, -ear pain, -sore throat Cardiology:  -chest pain, -palpitations, -edema Respiratory: -cough, -shortness of breath, -wheezing Gastroenterology: -abdominal pain, -nausea, -vomiting, -diarrhea, -constipation  Hematology: -bleeding or bruising problems Musculoskeletal: -arthralgias, -myalgias, -joint swelling, -back pain Ophthalmology: -vision changes Urology: -dysuria, -difficulty urinating, -hematuria, -urinary frequency,  -urgency Neurology: -headache, -weakness, -tingling, -numbness       Objective:   Physical Exam BP 130/80   Pulse 80   Ht 5\' 6"  (1.676 m)   Wt 267 lb (121.1 kg)   BMI 43.09 kg/m   General Appearance:    Alert, cooperative, no distress, appears stated age  Head:    Normocephalic, without obvious abnormality, atraumatic  Eyes:    PERRL, conjunctiva/corneas clear, EOM's intact  Ears:    Normal TM's and external ear canals  Nose:   Mask on   Throat:   Mask on   Neck:   Supple, no lymphadenopathy;  thyroid:  no   enlargement/tenderness/nodules; no JVD  Back:    Spine nontender, no curvature, ROM normal, no CVA     tenderness  Lungs:     Clear to auscultation bilaterally without wheezes, rales or     ronchi; respirations unlabored  Chest Wall:    No tenderness or deformity   Heart:    Regular rate and rhythm, S1 and S2 normal, no murmur, rub   or gallop  Breast Exam:    No tenderness, masses, or nipple discharge or inversion.      No axillary lymphadenopathy  Abdomen:     Soft, non-tender, nondistended, normoactive bowel sounds,    no masses, no hepatosplenomegaly  Genitalia:    Normal external genitalia without lesions.  BUS and vagina normal; cervix without lesions. No abnormal vaginal discharge. Pap not performed. Chaperone present   Rectal:    Not performed due to age<40 and no related complaints  Extremities:   No clubbing, cyanosis or edema  Pulses:  2+ and symmetric all extremities  Skin:   Skin color, texture, turgor normal, no rashes or lesions  Lymph nodes:   Cervical, supraclavicular, and axillary nodes normal  Neurologic:   CNII-XII intact, normal strength, sensation and gait          Psych:   Normal mood, affect, hygiene and grooming.       Assessment & Plan:  Routine general medical examination at a health care facility - Plan: CBC with Differential/Platelet, TSH, T4, free, T3, Lipid panel, Comprehensive metabolic panel -She is here today for a CPE.  Preventive  health care reviewed.  Counseling on healthy lifestyle including diet and exercise.  Recommend regular dental and eye exams.  Immunizations reviewed.  Discussed safety and health promotion  Fatigue, unspecified type - Plan: CBC with Differential/Platelet, VITAMIN D 25 Hydroxy (Vit-D Deficiency, Fractures), Vitamin B12, TSH, T4, free, T3, Hemoglobin A1c, Comprehensive metabolic panel -Discussed multiple etiologies for fatigue.  History of anemia and is now on iron.  Check labs and follow-up  Iron deficiency anemia, unspecified iron deficiency anemia type - Plan: Vitamin B12, Iron, TIBC and Ferritin Panel  Severe obesity (BMI >= 40) (HCC) - Plan: TSH, T4, free, T3, Lipid panel, Hemoglobin A1c -Counseling on healthy diet and physical activity to help with weight loss.  Recommend cutting back on calories, portion sizes and carbohydrates.  Vaginal irritation - Plan: RPR, HIV Antibody (routine testing w rflx), NuSwab Vaginitis Plus (VG+) -Follow-up pending results.  Screen for STD (sexually transmitted disease) - Plan: RPR, HIV Antibody (routine testing w rflx), NuSwab Vaginitis Plus (VG+)

## 2020-09-04 ENCOUNTER — Other Ambulatory Visit: Payer: Self-pay | Admitting: Family Medicine

## 2020-09-04 ENCOUNTER — Other Ambulatory Visit: Payer: Self-pay | Admitting: Internal Medicine

## 2020-09-04 DIAGNOSIS — E559 Vitamin D deficiency, unspecified: Secondary | ICD-10-CM

## 2020-09-04 DIAGNOSIS — D509 Iron deficiency anemia, unspecified: Secondary | ICD-10-CM

## 2020-09-04 DIAGNOSIS — R5383 Other fatigue: Secondary | ICD-10-CM

## 2020-09-04 LAB — COMPREHENSIVE METABOLIC PANEL
ALT: 10 IU/L (ref 0–32)
AST: 16 IU/L (ref 0–40)
Albumin/Globulin Ratio: 1.1 — ABNORMAL LOW (ref 1.2–2.2)
Albumin: 4 g/dL (ref 3.8–4.8)
Alkaline Phosphatase: 70 IU/L (ref 44–121)
BUN/Creatinine Ratio: 21 (ref 9–23)
BUN: 14 mg/dL (ref 6–20)
Bilirubin Total: 0.2 mg/dL (ref 0.0–1.2)
CO2: 20 mmol/L (ref 20–29)
Calcium: 9.1 mg/dL (ref 8.7–10.2)
Chloride: 99 mmol/L (ref 96–106)
Creatinine, Ser: 0.67 mg/dL (ref 0.57–1.00)
Globulin, Total: 3.5 g/dL (ref 1.5–4.5)
Glucose: 89 mg/dL (ref 65–99)
Potassium: 4 mmol/L (ref 3.5–5.2)
Sodium: 134 mmol/L (ref 134–144)
Total Protein: 7.5 g/dL (ref 6.0–8.5)
eGFR: 116 mL/min/{1.73_m2} (ref >59)

## 2020-09-04 LAB — CBC WITH DIFFERENTIAL/PLATELET
Eos: 1 %
Hematocrit: 32.7 % — ABNORMAL LOW (ref 34.0–46.6)
Hemoglobin: 9.8 g/dL — ABNORMAL LOW (ref 11.1–15.9)
Immature Granulocytes: 0 %
Lymphocytes Absolute: 3.4 10*3/uL — ABNORMAL HIGH (ref 0.7–3.1)
Lymphs: 29 %
MCH: 21.4 pg — ABNORMAL LOW (ref 26.6–33.0)
MCV: 71 fL — ABNORMAL LOW (ref 79–97)
Monocytes Absolute: 0.7 10*3/uL (ref 0.1–0.9)
Monocytes: 6 %
Neutrophils: 64 %
Platelets: 327 10*3/uL (ref 150–450)
WBC: 11.7 10*3/uL — ABNORMAL HIGH (ref 3.4–10.8)

## 2020-09-04 LAB — IRON,TIBC AND FERRITIN PANEL
Ferritin: 11 ng/mL — ABNORMAL LOW (ref 15–150)
Iron Saturation: 6 % — CL (ref 15–55)
Iron: 23 ug/dL — ABNORMAL LOW (ref 27–159)
Total Iron Binding Capacity: 390 ug/dL (ref 250–450)
UIBC: 367 ug/dL (ref 131–425)

## 2020-09-04 LAB — LIPID PANEL
Chol/HDL Ratio: 3.1 ratio (ref 0.0–4.4)
Cholesterol, Total: 185 mg/dL (ref 100–199)
HDL: 59 mg/dL (ref >39)
LDL Chol Calc (NIH): 116 mg/dL — ABNORMAL HIGH (ref 0–99)
Triglycerides: 50 mg/dL (ref 0–149)
VLDL Cholesterol Cal: 10 mg/dL (ref 5–40)

## 2020-09-04 LAB — TSH: TSH: 0.899 u[IU]/mL (ref 0.450–4.500)

## 2020-09-04 LAB — VITAMIN B12: Vitamin B-12: 536 pg/mL (ref 232–1245)

## 2020-09-04 LAB — HIV ANTIBODY (ROUTINE TESTING W REFLEX): HIV Screen 4th Generation wRfx: NONREACTIVE

## 2020-09-04 LAB — RPR: RPR Ser Ql: NONREACTIVE

## 2020-09-04 LAB — T3: T3, Total: 126 ng/dL (ref 71–180)

## 2020-09-04 LAB — VITAMIN D 25 HYDROXY (VIT D DEFICIENCY, FRACTURES): Vit D, 25-Hydroxy: 13.8 ng/mL — ABNORMAL LOW (ref 30.0–100.0)

## 2020-09-04 LAB — HEMOGLOBIN A1C
Est. average glucose Bld gHb Est-mCnc: 105 mg/dL
Hgb A1c MFr Bld: 5.3 % (ref 4.8–5.6)

## 2020-09-04 LAB — T4, FREE: Free T4: 1.24 ng/dL (ref 0.82–1.77)

## 2020-09-04 MED ORDER — VITAMIN D (ERGOCALCIFEROL) 1.25 MG (50000 UNIT) PO CAPS
50000.0000 [IU] | ORAL_CAPSULE | ORAL | 0 refills | Status: DC
Start: 2020-09-04 — End: 2021-11-06

## 2020-09-04 NOTE — Progress Notes (Signed)
   Subjective:    Patient ID: Katie Morgan, female    DOB: 1986/03/29, 35 y.o.   MRN: [id number]  HPI    Review of Systems     Objective:   Physical Exam        Assessment & Plan:

## 2020-09-04 NOTE — Progress Notes (Signed)
Her iron is low so I recommend she start on once daily OTC iron. I also recommend taking a stool softener with each iron dose.  Her vitamin D is low. I will send in a prescription for vitamin D for her to take once weekly for the next 12 weeks. Once this is done, start on OTC vitamin D3 1,000 IUs daily.  Return for recheck of CBC, iron, TIBC and Ferritin in 2 months. Please order and schedule the lab visit.  Her other labs are fine and she is negative for HIV and syphilis. Still waiting on the swab results.

## 2020-09-05 ENCOUNTER — Other Ambulatory Visit: Payer: Self-pay | Admitting: Family Medicine

## 2020-09-05 DIAGNOSIS — B9689 Other specified bacterial agents as the cause of diseases classified elsewhere: Secondary | ICD-10-CM

## 2020-09-05 LAB — NUSWAB VAGINITIS PLUS (VG+)
Atopobium vaginae: HIGH Score — AB
BVAB 2: HIGH Score — AB
Candida albicans, NAA: NEGATIVE
Candida glabrata, NAA: NEGATIVE
Chlamydia trachomatis, NAA: NEGATIVE
Megasphaera 1: HIGH Score — AB
Neisseria gonorrhoeae, NAA: NEGATIVE
Trich vag by NAA: NEGATIVE

## 2020-09-05 MED ORDER — METRONIDAZOLE 500 MG PO TABS: 500.0000 mg | ORAL_TABLET | Freq: Two times a day (BID) | ORAL | 0 refills | Status: DC

## 2020-11-04 ENCOUNTER — Other Ambulatory Visit: Payer: Commercial Managed Care - PPO

## 2020-11-20 ENCOUNTER — Other Ambulatory Visit: Payer: Self-pay | Admitting: Family Medicine

## 2020-11-20 DIAGNOSIS — E559 Vitamin D deficiency, unspecified: Secondary | ICD-10-CM

## 2020-11-20 NOTE — Telephone Encounter (Signed)
Pt was advised with labs to take over the counter when finally rx for vitamin d

## 2021-09-05 ENCOUNTER — Encounter: Payer: Commercial Managed Care - PPO | Admitting: Physician Assistant

## 2021-11-06 ENCOUNTER — Encounter: Payer: Self-pay | Admitting: Physician Assistant

## 2021-11-06 ENCOUNTER — Other Ambulatory Visit (HOSPITAL_COMMUNITY)
Admission: RE | Admit: 2021-11-06 | Discharge: 2021-11-06 | Disposition: A | Discharge status: Home / Self Care | Payer: Commercial Managed Care - PPO | Source: Ambulatory Visit | Attending: Physician Assistant | Admitting: Physician Assistant

## 2021-11-06 ENCOUNTER — Ambulatory Visit (INDEPENDENT_AMBULATORY_CARE_PROVIDER_SITE_OTHER): Payer: Commercial Managed Care - PPO | Admitting: Physician Assistant

## 2021-11-06 VITALS — BP 110/70 | HR 85 | Ht 68.0 in | Wt 277.8 lb

## 2021-11-06 DIAGNOSIS — D508 Other iron deficiency anemias: Secondary | ICD-10-CM

## 2021-11-06 DIAGNOSIS — E66813 Obesity, class 3: Secondary | ICD-10-CM

## 2021-11-06 DIAGNOSIS — Z0000 Encounter for general adult medical examination without abnormal findings: Secondary | ICD-10-CM

## 2021-11-06 DIAGNOSIS — Z01419 Encounter for gynecological examination (general) (routine) without abnormal findings: Secondary | ICD-10-CM | POA: Insufficient documentation

## 2021-11-06 DIAGNOSIS — Z124 Encounter for screening for malignant neoplasm of cervix: Secondary | ICD-10-CM | POA: Insufficient documentation

## 2021-11-06 DIAGNOSIS — Z6841 Body Mass Index (BMI) 40.0 and over, adult: Secondary | ICD-10-CM

## 2021-11-06 DIAGNOSIS — M25511 Pain in right shoulder: Secondary | ICD-10-CM

## 2021-11-06 DIAGNOSIS — E661 Drug-induced obesity: Secondary | ICD-10-CM

## 2021-11-06 DIAGNOSIS — E559 Vitamin D deficiency, unspecified: Secondary | ICD-10-CM

## 2021-11-06 DIAGNOSIS — F5101 Primary insomnia: Secondary | ICD-10-CM

## 2021-11-06 DIAGNOSIS — E785 Hyperlipidemia, unspecified: Secondary | ICD-10-CM | POA: Diagnosis not present

## 2021-11-06 DIAGNOSIS — G8929 Other chronic pain: Secondary | ICD-10-CM | POA: Insufficient documentation

## 2021-11-06 DIAGNOSIS — M25512 Pain in left shoulder: Secondary | ICD-10-CM

## 2021-11-06 DIAGNOSIS — N6489 Other specified disorders of breast: Secondary | ICD-10-CM | POA: Insufficient documentation

## 2021-11-06 DIAGNOSIS — Z113 Encounter for screening for infections with a predominantly sexual mode of transmission: Secondary | ICD-10-CM

## 2021-11-06 MED ORDER — TRAZODONE HCL 50 MG PO TABS: 25.0000 mg | ORAL_TABLET | Freq: Every evening | ORAL | 3 refills | Status: DC | PRN

## 2021-11-06 MED ORDER — VITAMIN D (ERGOCALCIFEROL) 1.25 MG (50000 UNIT) PO CAPS: 50000.0000 [IU] | ORAL_CAPSULE | ORAL | 3 refills | Status: DC

## 2021-11-06 NOTE — Assessment & Plan Note (Signed)
Referral to plastic surgery to discuss reduction 

## 2021-11-06 NOTE — Assessment & Plan Note (Signed)
Reports has had trouble falling and staying asleep for years, start Trazodone 50 mg 1/2 - 1 tab as needed for sleep

## 2021-11-06 NOTE — Patient Instructions (Addendum)
Preventative Care for Adults - Female      MAINTAIN REGULAR HEALTH EXAMS: A routine yearly physical is a good way to check in with your primary care provider about your health and preventive screening. It is also an opportunity to share updates about your health and any concerns you have, and receive a thorough all-over exam.  Most health insurance companies pay for at least some preventative services.  Check with your health plan for specific coverages.  WHAT PREVENTATIVE SERVICES DO WOMEN NEED? Adult women should have their weight and blood pressure checked regularly.  Women age 38 and older should have their cholesterol levels checked regularly. Women should be screened for cervical cancer with a Pap smear and pelvic exam beginning at either age 72, or 3 years after they become sexually activity.   Breast cancer screening generally begins at age 76 with a mammogram and breast exam by your primary care provider.   Beginning at age 71 and continuing to age 13, women should be screened for colorectal cancer.  Certain people may need continued testing until age 84. Updating vaccinations is part of preventative care.  Vaccinations help protect against diseases such as the flu. Osteoporosis is a disease in which the bones lose minerals and strength as we age. Women ages 53 and over should discuss this with their caregivers, as should women after menopause who have other risk factors. Lab tests are generally done as part of preventative care to screen for anemia and blood disorders, to screen for problems with the kidneys and liver, to screen for bladder problems, to check blood sugar, and to check your cholesterol level. Preventative services generally include counseling about diet, exercise, avoiding tobacco, drugs, excessive alcohol consumption, and sexually transmitted infections.    GENERAL RECOMMENDATIONS FOR GOOD HEALTH:  Healthy diet: Eat a variety of foods, including fruit, vegetables,  animal or vegetable protein, such as meat, fish, chicken, and eggs, or beans, lentils, tofu, and grains, such as rice. Drink plenty of water daily (60 - 80 ounces or 8 - 10 glasses a day) Decrease saturated fat in the diet, avoid lots of red meat, processed foods, sweets, fast foods, and fried foods. For high cholesterol - Increase fiber intake (Benefiber or Metamucil, Cherrios,  oatmeal, beans, nuts, fruits and vegetables), limit saturated fats (in fried foods, red meat), can add OTC fish oil supplement, eat fish with Omega-3 fatty acids like salmon and tuna, exercise for 30 minutes 3 - 5 times a week, drink 8 - 10 glasses of water a day  Exercise: Aerobic exercise helps maintain good heart health. At least 30-40 minutes of moderate-intensity exercise is recommended. For example, a brisk walk that increases your heart rate and breathing. This should be done on most days of the week.  Find a type of exercise or a variety of exercises that you enjoy so that it becomes a part of your daily life.  Examples are running, walking, swimming, water aerobics, and biking.  For motivation and support, explore group exercise such as aerobic class, spin class, Zumba, Yoga,or  martial arts, etc.   Set exercise goals for yourself, such as a certain weight goal, walk or run in a race such as a 5k walk/run.  Speak to your primary care provider about exercise goals.  Disease prevention: If you smoke or chew tobacco, find out from your caregiver how to quit. It can literally save your life, no matter how long you have been a tobacco user. If you do not  use tobacco, never begin.  Maintain a healthy diet and normal weight. Increased weight leads to problems with blood pressure and diabetes.  The Body Mass Index or BMI is a way of measuring how much of your body is fat. Having a BMI above 27 increases the risk of heart disease, diabetes, hypertension, stroke and other problems related to obesity. Your caregiver can help  determine your BMI and based on it develop an exercise and dietary program to help you achieve or maintain this important measurement at a healthful level. High blood pressure causes heart and blood vessel problems.  Persistent high blood pressure should be treated with medicine if weight loss and exercise do not work.  Fat and cholesterol leaves deposits in your arteries that can block them. This causes heart disease and vessel disease elsewhere in your body.  If your cholesterol is found to be high, or if you have heart disease or certain other medical conditions, then you may need to have your cholesterol monitored frequently and be treated with medication.  Ask if you should have a cardiac stress test if your history suggests this. A stress test is a test done on a treadmill that looks for heart disease. This test can find disease prior to there being a problem. Menopause can be associated with physical symptoms and risks. Hormone replacement therapy is available to decrease these. You should talk to your caregiver about whether starting or continuing to take hormones is right for you.  Osteoporosis is a disease in which the bones lose minerals and strength as we age. This can result in serious bone fractures. Risk of osteoporosis can be identified using a bone density scan. Women ages 65 and over should discuss this with their caregivers, as should women after menopause who have other risk factors. Ask your caregiver whether you should be taking a calcium supplement and Vitamin D, to reduce the rate of osteoporosis.  Avoid drinking alcohol in excess (more than two drinks per day).  Avoid use of street drugs. Do not share needles with anyone. Ask for professional help if you need assistance or instructions on stopping the use of alcohol, cigarettes, and/or drugs. Brush your teeth twice a day with fluoride toothpaste, and floss once a day. Good oral hygiene prevents tooth decay and gum disease. The  problems can be painful, unattractive, and can cause other health problems. Visit your dentist for a routine oral and dental check up and preventive care every 6-12 months.  Look at your skin regularly.  Use a mirror to look at your back. Notify your caregivers of changes in moles, especially if there are changes in shapes, colors, a size larger than a pencil eraser, an irregular border, or development of new moles.  Safety: Use seatbelts 100% of the time, whether driving or as a passenger.  Use safety devices such as hearing protection if you work in environments with loud noise or significant background noise.  Use safety glasses when doing any work that could send debris in to the eyes.  Use a helmet if you ride a bike or motorcycle.  Use appropriate safety gear for contact sports.  Talk to your caregiver about gun safety. Use sunscreen with a SPF (or skin protection factor) of 15 or greater.  Lighter skinned people are at a greater risk of skin cancer. Don't forget to also wear sunglasses in order to protect your eyes from too much damaging sunlight. Damaging sunlight can accelerate cataract formation.  Practice safe sex. Use   condoms. Condoms are used for birth control and to help reduce the spread of sexually transmitted infections (or STIs).  Some of the STIs are gonorrhea (the clap), chlamydia, syphilis, trichomonas, herpes, HPV (human papilloma virus) and HIV (human immunodeficiency virus) which causes AIDS. The herpes, HIV and HPV are viral illnesses that have no cure. These can result in disability, cancer and death.  Keep carbon monoxide and smoke detectors in your home functioning at all times. Change the batteries every 6 months or use a model that plugs into the wall.   Vaccinations: Stay up to date with your tetanus shots and other required immunizations. You should have a booster for tetanus every 10 years. Be sure to get your flu shot every year, since 5%-20% of the [place] population comes  down with the flu. The flu vaccine changes each year, so being vaccinated once is not enough. Get your shot in the fall, before the flu season peaks.   Other vaccines to consider: Human Papilloma Virus or HPV causes cancer of the cervix, and other infections that can be transmitted from person to person. There is a vaccine for HPV, and females should get immunized between the ages of 11 and 26. It requires a series of 3 shots.  Pneumococcal vaccine to protect against certain types of pneumonia.  This is normally recommended for adults age 65 or older.  However, adults younger than 38 years old with certain underlying conditions such as diabetes, heart or lung disease should also receive the vaccine. Shingles vaccine to protect against Varicella Zoster if you are older than age 60, or younger than 38 years old with certain underlying illness. Hepatitis A vaccine to protect against a form of infection of the liver by a virus acquired from food. Hepatitis B vaccine to protect against a form of infection of the liver by a virus acquired from blood or body fluids, particularly if you work in health care. If you plan to travel internationally, check with your local health department for specific vaccination recommendations.  Cancer Screening: Breast cancer screening is essential to preventive care for women. All women age 20 and older should perform a breast self-exam every month. At age 40 and older, women should have their caregiver complete a breast exam each year. Women at ages 40 and older should have a mammogram (x-ray film) of the breasts. Your caregiver can discuss how often you need mammograms.   Cervical cancer screening includes taking a Pap smear (sample of cells examined under a microscope) from the cervix (end of the uterus). It also includes testing for HPV (Human Papilloma Virus, which can cause cervical cancer). Screening and a pelvic exam should begin at age 21, or 3 years after a woman  becomes sexually active. Screening should occur every year, with a Pap smear but no HPV testing, up to age 30. After age 30, you should have a Pap smear every 3 years with HPV testing, if no HPV was found previously.  Most routine colon cancer screening begins at the age of 50. On a yearly basis, doctors may provide special easy to use take-home tests to check for hidden blood in the stool. Sigmoidoscopy or colonoscopy can detect the earliest forms of colon cancer and is life saving. These tests use a small camera at the end of a tube to directly examine the colon. Speak to your caregiver about this at age 50, when routine screening begins (and is repeated every 5 years unless early forms of pre-cancerous polyps   or small growths are found).   Take OTC iron supplements daily, like Slow Fe  OTC weight loss plans:  [facility] Weight Loss Plan Mediterranean Diet Intermittent Fasting Weight Watchers [employer] Weight Loss Program Nutrisystem Diet Paleo Diet Atkins Diet [facility]   You will get a call to schedule an appointment with Healthy weight and [facility]astic Surgery

## 2021-11-06 NOTE — Assessment & Plan Note (Signed)
Controlled, eat a low fat diet, increase fiber intake (Benefiber or Metamucil, Cherrios,  oatmeal, beans, nuts, fruits and vegetables), limit saturated fats (in fried foods, red meat), can add OTC fish oil supplement, eat fish with Omega-3 fatty acids like salmon and tuna, exercise for 30 minutes 3 - 5 times a week, drink 8 - 10 glasses of water a day ? ? ?

## 2021-11-06 NOTE — Progress Notes (Signed)
Complete physical exam  Patient: Katie Morgan   DOB: 01-17-1986   35 y.o. Female  MRN: [id number]  Subjective:    Chief Complaint  Patient presents with   Annual Exam    Fasting CPE and pap    Katie Morgan is a 36 y.o. female who presents today for a complete physical exam. Reports is generally feeling well; is eating a healthy diet; is sleeping well 5 hours; drinks 3 - 4 bottles of water a day; is not exercising, is very active at work walking, climbing; was taking iron supplements, but stopped  Most recent fall risk assessment:    11/06/2021    8:21 AM  Fall Risk   Falls in the past year? 0  Number falls in past yr: 0  Injury with Fall? 0  Risk for fall due to : No Fall Risks  Follow up Falls evaluation completed     Most recent depression screenings:    11/06/2021    8:22 AM 09/03/2020    8:28 AM  PHQ 2/9 Scores  PHQ - 2 Score 0 0        Patient Care Team: Lexine Baton as PCP - General (Physician Assistant)   Outpatient Medications Prior to Visit  Medication Sig Note   [DISCONTINUED] metroNIDAZOLE (FLAGYL) 500 MG tablet Take 1 tablet (500 mg total) by mouth 2 (two) times daily. (Patient not taking: Reported on 11/06/2021)    [DISCONTINUED] Vitamin D, Ergocalciferol, (DRISDOL) 1.25 MG (50000 UNIT) CAPS capsule Take 1 capsule (50,000 Units total) by mouth every 7 (seven) days. (Patient not taking: Reported on 11/06/2021) 11/06/2021: Not taking but would like to start back.   No facility-administered medications prior to visit.    Review of Systems  Constitutional:  Negative for fever.  HENT:  Negative for congestion.   Eyes:  Negative for blurred vision.  Respiratory:  Negative for cough.   Cardiovascular:  Negative for leg swelling.  Gastrointestinal:  Negative for constipation, diarrhea, nausea and vomiting.  Genitourinary:  Negative for dysuria.  Musculoskeletal:  Positive for joint pain.  Skin:  Negative for rash.   Psychiatric/Behavioral:  The patient has insomnia.          Objective:     BP 110/70   Pulse 85   Ht 5\' 8"  (1.727 m)   Wt 277 lb 12.8 oz (126 kg)   LMP 10/22/2021 (Approximate)   BMI 42.24 kg/m    Physical Exam Vitals and nursing note reviewed.  Constitutional:      General: She is not in acute distress.    Appearance: Normal appearance. She is not ill-appearing.  HENT:     Head: Normocephalic and atraumatic.     Right Ear: Tympanic membrane, ear canal and external ear normal.     Left Ear: Tympanic membrane, ear canal and external ear normal.     Nose: No congestion.  Eyes:     Extraocular Movements: Extraocular movements intact.     Conjunctiva/sclera: Conjunctivae normal.     Pupils: Pupils are equal, round, and reactive to light.  Neck:     Vascular: No carotid bruit.  Cardiovascular:     Rate and Rhythm: Normal rate and regular rhythm.     Pulses: Normal pulses.     Heart sounds: Normal heart sounds.  Pulmonary:     Effort: Pulmonary effort is normal.     Breath sounds: Normal breath sounds. No wheezing.  Chest:  Breasts:    Right: Normal.  No inverted nipple, mass, nipple discharge, skin change or tenderness.     Left: Normal. No inverted nipple, mass, nipple discharge, skin change or tenderness.     Comments: Pendulous breasts Abdominal:     General: Bowel sounds are normal.     Palpations: Abdomen is soft.  Genitourinary:    Comments: Pelvic Exam: External Genitalia:  No lesions, BUS negative for discharge Vagina:  Pink, good support, normal discharge Cervix: No lesions present, negative CMT Uterus:  NSSC mobile and non tender Adnexa:  No masses or tenderness bilaterally  Musculoskeletal:        General: Normal range of motion.     Cervical back: Normal range of motion and neck supple.     Right lower leg: No edema.     Left lower leg: No edema.  Skin:    General: Skin is warm and dry.     Findings: No bruising.  Neurological:     General: No  focal deficit present.     Mental Status: She is alert and oriented to person, place, and time.  Psychiatric:        Mood and Affect: Mood normal.        Behavior: Behavior normal.        Thought Content: Thought content normal.     No results found for any visits on 11/06/21.      CLINICAL DATA:  Chronic left shoulder pain, popping, weakness and decreased range of motion. No known injury.   EXAM: MRI OF THE LEFT SHOULDER WITHOUT CONTRAST   TECHNIQUE: Multiplanar, multisequence MR imaging of the shoulder was performed. No intravenous contrast was administered.   COMPARISON:  None.   FINDINGS: Rotator cuff: Rotator cuff tendinopathy is worst in the supraspinatus. There is an interstitial tear of the supraspinatus at the greater tuberosity measuring 0.8 cm from front to back. No tendon retraction. The rotator cuff is otherwise intact.   Muscles:  Normal without atrophy or focal lesion.   Biceps long head:  Intact and normal appearance.   Acromioclavicular Joint: Appears normal. Type 2 acromion. The acromion is downsloping with some subacromial spurring. Small volume of fluid is present in the subacromial/subdeltoid bursa.   Glenohumeral Joint: Appears normal.   Labrum:  Intact.   Bones:  No fracture or focal lesion.   Other: None.   IMPRESSION: Rotator cuff tendinopathy is worst in the supraspinatus where there is a 0.8 cm interstitial tear without retraction or atrophy.   Type 2 acromion is downsloping with some subacromial spurring.   Small volume of fluid in the subacromial/subdeltoid bursa compatible with bursitis.     Electronically Signed   By: Drusilla Kanner M.D.   On: 08/09/2019 11:07   Assessment & Plan:    Routine Health Maintenance and Physical Exam  Immunization History  Administered Date(s) Administered   Influenza Split 04/27/2016, 07/16/2018   PFIZER(Purple Top)SARS-COV-2 Vaccination 09/23/2019, 10/17/2019   Tdap 04/06/2016     Health Maintenance  Topic Date Due   PAP SMEAR-Modifier  08/09/2021   COVID-19 Vaccine (3 - Booster for Pfizer series) 11/22/2021 (Originally 12/12/2019)   INFLUENZA VACCINE  01/13/2022   TETANUS/TDAP  04/06/2026   Hepatitis C Screening  Completed   HIV Screening  Completed   HPV VACCINES  Aged Out    Discussed health benefits of physical activity, and encouraged her to engage in regular exercise appropriate for her age and condition.  Problem List Items Addressed This Visit       Other  Anemia, iron deficiency    Unknown status, iron labs drawn today; re-start oral iron supplements with food       Relevant Orders   Folate   Fe+CBC/D/Plt+TIBC+Fer+Retic   Elevated lipids    Controlled, eat a low fat diet, increase fiber intake (Benefiber or Metamucil, Cherrios,  oatmeal, beans, nuts, fruits and vegetables), limit saturated fats (in fried foods, red meat), can add OTC fish oil supplement, eat fish with Omega-3 fatty acids like salmon and tuna, exercise for 30 minutes 3 - 5 times a week, drink 8 - 10 glasses of water a day        Relevant Orders   Lipid panel   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 [facility](HCC)    Will refer to [facility] to discuss weight loss options       Relevant Orders   Comprehensive metabolic panel   Lipid panel   Amb Ref to Medical Weight Management   Chronic pain of both shoulders    08/09/2019 left shoulder MRI revealed rotator cuff tendinopathy, worst in supraspinatus where there is a 0.8 cn interstitial tear       Relevant Medications   traZODone (DESYREL) 50 MG tablet   Bilateral pendulous breasts    Referral to plastic surgery to discuss reduction       Relevant Orders   Ambulatory referral to Plastic Surgery   Primary insomnia    Reports has had trouble falling and staying asleep for years, start Trazodone 50 mg 1/2 - 1 tab as needed for sleep       Vitamin D deficiency     Vitamin D level checked today       Relevant Orders   Fe+CBC/D/Plt+TIBC+Fer+Retic   Other Visit Diagnoses     Routine medical exam    -  Primary   Relevant Orders   Comprehensive metabolic panel   Lipid panel   Women's annual routine gynecological examination       Relevant Orders   Cytology - PAP   Cervical cancer screening       Relevant Orders   Cytology - PAP   Screening for STD (sexually transmitted disease)       Relevant Orders   RPR+HIV+GC+CT Panel      Return in about 1 year (around 11/07/2022) for Return for Annual Exam with PCP Mayford KnifeWilliams.     Jake SharkLynne B Xian Alves, PA-C

## 2021-11-06 NOTE — Assessment & Plan Note (Signed)
Will refer to [facility] to discuss weight loss options

## 2021-11-06 NOTE — Assessment & Plan Note (Signed)
Vitamin D level checked today 

## 2021-11-06 NOTE — Assessment & Plan Note (Signed)
08/09/2019 left shoulder MRI revealed rotator cuff tendinopathy, worst in supraspinatus where there is a 0.8 cn interstitial tear

## 2021-11-06 NOTE — Assessment & Plan Note (Signed)
Unknown status, iron labs drawn today; re-start oral iron supplements with food

## 2021-11-06 NOTE — Assessment & Plan Note (Signed)
>>  ASSESSMENT AND PLAN FOR CLASS 3 DRUG-INDUCED OBESITY WITH BODY MASS INDEX (BMI) OF 40.0 TO 44.9 IN ADULT (HCC) WRITTEN ON 11/06/2021 12:20 PM BY Mayford Knife, LYNNE B, PA-C  Will refer to [facility] to discuss weight loss options

## 2021-11-07 LAB — CYTOLOGY - PAP: Diagnosis: NEGATIVE

## 2021-11-09 LAB — FE+CBC/D/PLT+TIBC+FER+RETIC
Basophils Absolute: 0 10*3/uL (ref 0.0–0.2)
Basos: 0 %
EOS (ABSOLUTE): 0.1 10*3/uL (ref 0.0–0.4)
Eos: 1 %
Ferritin: 13 ng/mL — ABNORMAL LOW (ref 15–150)
Hematocrit: 35.4 % (ref 34.0–46.6)
Hemoglobin: 10.4 g/dL — ABNORMAL LOW (ref 11.1–15.9)
Immature Grans (Abs): 0 10*3/uL (ref 0.0–0.1)
Immature Granulocytes: 0 %
Iron Saturation: 8 % — CL (ref 15–55)
Iron: 32 ug/dL (ref 27–159)
Lymphocytes Absolute: 2.9 10*3/uL (ref 0.7–3.1)
Lymphs: 27 %
MCH: 22.1 pg — ABNORMAL LOW (ref 26.6–33.0)
MCHC: 29.4 g/dL — ABNORMAL LOW (ref 31.5–35.7)
MCV: 75 fL — ABNORMAL LOW (ref 79–97)
Monocytes Absolute: 0.5 10*3/uL (ref 0.1–0.9)
Monocytes: 5 %
Neutrophils Absolute: 7.3 10*3/uL — ABNORMAL HIGH (ref 1.4–7.0)
Neutrophils: 67 %
Platelets: 298 10*3/uL (ref 150–450)
RBC: 4.71 x10E6/uL (ref 3.77–5.28)
RDW: 15.2 % (ref 11.7–15.4)
Retic Ct Pct: 2 % (ref 0.6–2.6)
Total Iron Binding Capacity: 385 ug/dL (ref 250–450)
UIBC: 353 ug/dL (ref 131–425)
WBC: 10.9 10*3/uL — ABNORMAL HIGH (ref 3.4–10.8)

## 2021-11-09 LAB — COMPREHENSIVE METABOLIC PANEL
ALT: 14 IU/L (ref 0–32)
AST: 15 IU/L (ref 0–40)
Albumin/Globulin Ratio: 1.2 (ref 1.2–2.2)
Albumin: 4 g/dL (ref 3.8–4.8)
Alkaline Phosphatase: 85 IU/L (ref 44–121)
BUN/Creatinine Ratio: 14 (ref 9–23)
BUN: 10 mg/dL (ref 6–20)
Bilirubin Total: 0.3 mg/dL (ref 0.0–1.2)
CO2: 23 mmol/L (ref 20–29)
Calcium: 9.3 mg/dL (ref 8.7–10.2)
Chloride: 101 mmol/L (ref 96–106)
Creatinine, Ser: 0.7 mg/dL (ref 0.57–1.00)
Globulin, Total: 3.3 g/dL (ref 1.5–4.5)
Glucose: 81 mg/dL (ref 70–99)
Potassium: 3.9 mmol/L (ref 3.5–5.2)
Sodium: 137 mmol/L (ref 134–144)
Total Protein: 7.3 g/dL (ref 6.0–8.5)
eGFR: 114 mL/min/{1.73_m2} (ref >59)

## 2021-11-09 LAB — LIPID PANEL
Chol/HDL Ratio: 2.9 ratio (ref 0.0–4.4)
Cholesterol, Total: 182 mg/dL (ref 100–199)
HDL: 62 mg/dL (ref >39)
LDL Chol Calc (NIH): 106 mg/dL — ABNORMAL HIGH (ref 0–99)
Triglycerides: 76 mg/dL (ref 0–149)
VLDL Cholesterol Cal: 14 mg/dL (ref 5–40)

## 2021-11-09 LAB — RPR+HIV+GC+CT PANEL
Chlamydia trachomatis, NAA: NEGATIVE
HIV Screen 4th Generation wRfx: NONREACTIVE
Neisseria Gonorrhoeae by PCR: NEGATIVE
RPR Ser Ql: NONREACTIVE

## 2021-11-09 LAB — FOLATE: Folate: 4.5 ng/mL (ref >3.0)

## 2021-11-19 ENCOUNTER — Institutional Professional Consult (permissible substitution): Payer: Commercial Managed Care - PPO | Admitting: Plastic Surgery

## 2021-11-28 ENCOUNTER — Encounter: Payer: Self-pay | Admitting: Plastic Surgery

## 2021-11-28 ENCOUNTER — Ambulatory Visit: Payer: Commercial Managed Care - PPO | Admitting: Plastic Surgery

## 2021-11-28 VITALS — BP 115/66 | HR 90 | Ht 66.0 in | Wt 280.8 lb

## 2021-11-28 DIAGNOSIS — M546 Pain in thoracic spine: Secondary | ICD-10-CM

## 2021-11-28 DIAGNOSIS — Z6841 Body Mass Index (BMI) 40.0 and over, adult: Secondary | ICD-10-CM

## 2021-11-28 DIAGNOSIS — M545 Low back pain, unspecified: Secondary | ICD-10-CM | POA: Diagnosis not present

## 2021-11-28 DIAGNOSIS — N62 Hypertrophy of breast: Secondary | ICD-10-CM | POA: Diagnosis not present

## 2021-11-28 DIAGNOSIS — Z803 Family history of malignant neoplasm of breast: Secondary | ICD-10-CM

## 2021-11-28 DIAGNOSIS — G8929 Other chronic pain: Secondary | ICD-10-CM

## 2021-11-28 DIAGNOSIS — M542 Cervicalgia: Secondary | ICD-10-CM | POA: Diagnosis not present

## 2021-11-29 NOTE — Progress Notes (Signed)
Referring Provider Jake Shark, PA-C [address] [place],  [place] [address]   CC:  Breast hypertrophy and back pain.   Katie Morgan is an 36 y.o. female.  HPI:   The patient is a 36 y.o. female with a history of mammary hyperplasia for several years.  She has extremely large breasts causing symptoms that include the following: Back pain in the upper and lower back, including neck pain. She pulls or pins her bra straps to provide better lift and relief of the pressure and pain. She notices relief by holding her breast up manually.  Her shoulder straps cause grooves and pain and pressure that requires padding for relief. Pain medication is sometimes required with motrin and tylenol.  Activities that are hindered by enlarged breasts include: exercise and running.  She has tried supportive clothing as well as fitted bras without improvement.     Mammogram history: none due to age.  Family history of breast cancer:  maternal aunt.  Tobacco use:  none.   The patient expresses the desire to pursue surgical intervention.  The BMI = 45.  Preoperative bra size = 42H cup.   No Known Allergies  Outpatient Encounter Medications as of 11/28/2021  Medication Sig   traZODone (DESYREL) 50 MG tablet Take 0.5-1 tablets (25-50 mg total) by mouth at bedtime as needed for sleep.   Vitamin D, Ergocalciferol, (DRISDOL) 1.25 MG (50000 UNIT) CAPS capsule Take 1 capsule (50,000 Units total) by mouth every 7 (seven) days.   No facility-administered encounter medications on file as of 11/28/2021.     Past Medical History:  Diagnosis Date   Anemia    Morbid obesity (HCC)    Severe obesity (BMI >= 40) (HCC) 04/06/2016    No past surgical history on file.  Family History  Problem Relation Age of Onset   Obesity Mother    Heart failure Mother    Hyperlipidemia Mother    Obesity Father    Heart failure Father    Breast cancer Maternal Aunt 50   Cancer Maternal Grandmother        Leu  Gehrigs   Stroke Neg Hx    Hypertension Neg Hx    Diabetes Mellitus II Neg Hx    Colon cancer Neg Hx     Social History   Social History Narrative   Not on file     Review of Systems General: Denies fevers, chills, weight loss CV: Denies chest pain, shortness of breath, palpitations   Physical Exam    11/28/2021    1:29 PM 11/06/2021    8:22 AM 09/03/2020    8:27 AM  Vitals with BMI  Height 5\' 6"  5\' 8"  5\' 6"   Weight 280 lbs 13 oz 277 lbs 13 oz 267 lbs  BMI 45.34 42.25 43.12  Systolic 115 110  Diastolic 66 70 80  Pulse 90 85 80    General:  No acute distress,  Alert and oriented, Non-Toxic, Normal speech and affect Breast: No easily palpable breast masses on physical exam, significant breast ptosis and macromastia. Her breasts are extremely large and fairly symmetric.  She has hyperpigmentation of the inframammary area on both sides.  The sternal to nipple distance on the right is 44 cm and the left is 44 cm.  The IMF distance is 17 cm on the right and 17 cm on the left.  Base width is 21 bilaterally. Assessment/Plan   The patient has bilateral symptomatic macromastia.  She is  a good candidate for a breast reduction with possible free nipple.  We discussed the reasons why she may need a free nipple graft as well as the hypopigmentation, healing period, and loss of nipple sensation associated with free nipple grafting.  She is interested in pursuing surgical treatment.  She has tried supportive garments and fitted bras with no relief.  The details of breast reduction surgery were discussed.  I explained the procedure in detail along the with the expected scars.  The risks were discussed in detail and include bleeding, infection, damage to surrounding structures, need for additional procedures, nipple loss, change in nipple sensation, persistent pain, contour irregularities and asymmetries.  I explained that breast feeding is often not possible after breast reduction surgery.  We  discussed the expected postoperative course with an overall recovery period of about 1 month.  She demonstrated full understanding of all risks.  We discussed her personal risk factors that include high BMI.  The patient is interested in pursuing surgical treatment.  The estimated excess breast tissue to be removed at the time of surgery = >1000 grams on the left and >1000 grams on the right. [name] 11/29/2021, 10:54 AM

## 2021-12-04 ENCOUNTER — Telehealth: Payer: Self-pay | Admitting: Plastic Surgery

## 2021-12-04 NOTE — Telephone Encounter (Signed)
Prior authorization faxed to [facility], part of [facility], [phone]. Portal not showing member so auth had to be faxed in. Patient confirmed this is the current insurance. Please allow 15-30 days for insurance decision.

## 2021-12-12 ENCOUNTER — Encounter: Payer: Self-pay | Admitting: Internal Medicine

## 2021-12-18 ENCOUNTER — Telehealth: Payer: Self-pay | Admitting: Plastic Surgery

## 2021-12-18 NOTE — Telephone Encounter (Signed)
Contacted patient - she needs to confirm she can get time off work for surgery

## 2022-02-18 ENCOUNTER — Encounter: Payer: Self-pay | Admitting: Internal Medicine

## 2022-02-19 ENCOUNTER — Encounter (INDEPENDENT_AMBULATORY_CARE_PROVIDER_SITE_OTHER): Payer: Commercial Managed Care - PPO | Admitting: Family Medicine

## 2022-02-28 ENCOUNTER — Other Ambulatory Visit: Payer: Self-pay | Admitting: Physician Assistant

## 2022-03-24 ENCOUNTER — Encounter: Payer: Self-pay | Admitting: Internal Medicine

## 2022-04-08 NOTE — Progress Notes (Signed)
Chief Complaint  Patient presents with   other    Discuss weight loss medication   Patient presents to discuss weight loss, also complaining of reflux.  She states that her acid reflux has been getting worse. Tums helps some--doesn't want to take it too much, afraid of constipation. Triggered by anything "red"/tomatoes.  Doesn't drink alcohol, not much caffeine (drinks sprite), avoids spicy foods.  Notices it with chocolate.  Obesity: She was referred to medical weight management clinic by [facility] in May. She was told when they called it was the [facility] office, not [place] [facility]). Diet reviewed--she drinks whole milk, regular soda Fast food 2x/week, chick fil a--nuggets kids meal with mac n cheese, not fries Doesn't eat much carbs--no bread,  pasta. Some potatoes, fries.  She saw Dr. Erin Hearing (plastic surgery) in June to discuss breast reduction. It was approved, but she was afraid she might lose too much from the breast if she loses weight after  Prefers to try and lose weight first.  PMH, PSH, SH reviewed  Notable for iron deficiency anemia. Lab Results  Component Value Date   WBC 10.9 (H) 11/06/2021   HGB 10.4 (L) 11/06/2021   HCT 35.4 11/06/2021   MCV 75 (L) 11/06/2021   PLT 298 11/06/2021   Lab Results  Component Value Date   IRON 32 11/06/2021   TIBC 385 11/06/2021   FERRITIN 13 (L) 11/06/2021   Iron supplement recommended.  Lynne's Mychart message mentioned referring to hematologist, was never referred. She states she takes iron on and off (iron bisglycinate 78m) Periods are regular, lasts 5-7d, "normal". No bleeding elsewhere.  Vitamin D deficiency: Last check 08/2020, level low at 13.8. Didn't return for recheck as rec by Vickie; not repeated with labs done in May by LS[facility] Soda She was given a year's supply of weekly prescription, has been taking.   Outpatient Encounter Medications as of 04/09/2022  Medication Sig   Vitamin D, Ergocalciferol, (DRISDOL)  1.25 MG (50000 UNIT) CAPS capsule Take 1 capsule (50,000 Units total) by mouth every 7 (seven) days.   traZODone (DESYREL) 50 MG tablet TAKE 1/2 TO 1 TABLET(25 TO 50 MG) BY MOUTH AT BEDTIME AS NEEDED FOR SLEEP   No facility-administered encounter medications on file as of 04/09/2022.   No Known Allergies   ROS: No fever, chills, URI symptoms, chest pain, shortness of breath.  Reflux per HPI.  Weight gain.  Some fatigue.  Feels cold all the time. Moods good.   PHYSICAL EXAM:  BP 124/80   Pulse 72   Temp 98.3 F (36.8 C)   Wt 285 lb 3.2 oz (129.4 kg)   BMI 46.03 kg/m   Wt Readings from Last 3 Encounters:  04/09/22 285 lb 3.2 oz (129.4 kg)  11/28/21 280 lb 12.8 oz (127.4 kg)  11/06/21 277 lb 12.8 oz (126 kg)   Well-appearing, pleasant female in no distress She is alert and oriented.  Cranial nerves grossly intact, normal gait. She is in good spirits--slightly anxious related to the blood draw only. Normal eye contact, speech, hygiene and grooming    ASSESSMENT/PLAN:  Iron deficiency anemia, unspecified iron deficiency anemia type - recheck labs, and replace if needed - Plan: CBC with Differential/Platelet, Ferritin, Iron  Vitamin D deficiency - needs monitoring, on long-term prescription replacement.  Discussed this isn't usually used longterm - Plan: VITAMIN D 25 Hydroxy (Vit-D Deficiency, Fractures)  Gastroesophageal reflux disease, unspecified whether esophagitis present - counseled re: diet/behavior. Prilosec OTC x 2 weeks, f/b  either prilosec or pepcid prior to triggering meals or late eating.  Obesity, morbid, BMI 40.0-49.9 ([place]) - counseled re: diet (switch to skim or non-dairy milk, stop regular soda, cut back on carbs), exercise. Pharmquest study, [facility] referfal if not accepted  Fatigue, unspecified type - Plan: CBC with Differential/Platelet, TSH  Send to Sabrina to submit referral to pharmquest for wt loss study. Patient is to contact us to let us know if she  doesn't start this (doesn't work with her schedule or if she isn't a candidate). We will then refer to a weight loss clinic (either [facility] or [place], vs [place] office for [facility] clinic, whomever can get her in quickly; these are all convenient for her, but NOT [facility]).  Anemia--discussed no need for referral to hematologist (unless not fixing with iron supplementation--hasn't been taking regularly).  I spent 45 minutes dedicated to the care of this patient, including pre-visit review of records, face to face time, post-visit ordering of testing and documentation.

## 2022-04-09 ENCOUNTER — Encounter: Payer: Self-pay | Admitting: Family Medicine

## 2022-04-09 ENCOUNTER — Ambulatory Visit (INDEPENDENT_AMBULATORY_CARE_PROVIDER_SITE_OTHER): Payer: Commercial Managed Care - PPO | Admitting: Family Medicine

## 2022-04-09 VITALS — BP 124/80 | HR 72 | Temp 98.3°F | Wt 285.2 lb

## 2022-04-09 DIAGNOSIS — D509 Iron deficiency anemia, unspecified: Secondary | ICD-10-CM | POA: Diagnosis not present

## 2022-04-09 DIAGNOSIS — K219 Gastro-esophageal reflux disease without esophagitis: Secondary | ICD-10-CM | POA: Diagnosis not present

## 2022-04-09 DIAGNOSIS — E559 Vitamin D deficiency, unspecified: Secondary | ICD-10-CM

## 2022-04-09 DIAGNOSIS — R5383 Other fatigue: Secondary | ICD-10-CM

## 2022-04-09 NOTE — Patient Instructions (Addendum)
Take Prilosec OTC for 2 weeks. Try and avoid triggering foods (see handout). Eat small, frequent meals and wait at least 2-3 hours before laying down after eating. To try and prevent heartburn going forwards, you can take either Prilosec OTC or a Pepcid AC PRIOR to eating a meal that might trigger heartburn.  Weight loss will help.  We are referring you to see if you are elgiible to participate in a weight loss study through Pharmquest. If you are not a candidate for the study, then please contact us and we will put in the referral for the weight loss clinics that we discussed ([facility]either in [place] or [place] are [employer], vs the [facility], whichever can get you in sooner).  Check your MyChart tomorrow for your lab results. I will let you know my recommendations for iron supplementation and vitamin D. I usually switch people over to over-the-counter vitamin D3 daily for long-term maintenance, rather than staying on prescription.  Stop drinking regular soda and other caloric beverages. Switch from whole milk to either a skim or 1%, or to a plant-based milk (almondmilk or soy or oat)  Try and get grilled chicken rather than crispy/fried/breaded. Avoid the mac and cheese side--try and do a side salad, or fruit. Cut back on fries, and eat healthier sides. Drink more water.

## 2022-04-10 ENCOUNTER — Telehealth: Payer: Self-pay | Admitting: Internal Medicine

## 2022-04-10 LAB — CBC WITH DIFFERENTIAL/PLATELET
Basophils Absolute: 0 10*3/uL (ref 0.0–0.2)
Basos: 0 %
EOS (ABSOLUTE): 0.2 10*3/uL (ref 0.0–0.4)
Eos: 2 %
Hematocrit: 33.3 % — ABNORMAL LOW (ref 34.0–46.6)
Hemoglobin: 10.2 g/dL — ABNORMAL LOW (ref 11.1–15.9)
Immature Grans (Abs): 0 10*3/uL (ref 0.0–0.1)
Immature Granulocytes: 0 %
Lymphocytes Absolute: 3.4 10*3/uL — ABNORMAL HIGH (ref 0.7–3.1)
Lymphs: 30 %
MCH: 22.4 pg — ABNORMAL LOW (ref 26.6–33.0)
MCHC: 30.6 g/dL — ABNORMAL LOW (ref 31.5–35.7)
MCV: 73 fL — ABNORMAL LOW (ref 79–97)
Monocytes Absolute: 0.6 10*3/uL (ref 0.1–0.9)
Monocytes: 5 %
Neutrophils Absolute: 7 10*3/uL (ref 1.4–7.0)
Neutrophils: 63 %
Platelets: 336 10*3/uL (ref 150–450)
RBC: 4.55 x10E6/uL (ref 3.77–5.28)
RDW: 14.4 % (ref 11.7–15.4)
WBC: 11.3 10*3/uL — ABNORMAL HIGH (ref 3.4–10.8)

## 2022-04-10 LAB — VITAMIN D 25 HYDROXY (VIT D DEFICIENCY, FRACTURES): Vit D, 25-Hydroxy: 25.8 ng/mL — ABNORMAL LOW (ref 30.0–100.0)

## 2022-04-10 LAB — TSH: TSH: 1.06 u[IU]/mL (ref 0.450–4.500)

## 2022-04-10 LAB — IRON: Iron: 16 ug/dL — ABNORMAL LOW (ref 27–159)

## 2022-04-10 LAB — FERRITIN: Ferritin: 13 ng/mL — ABNORMAL LOW (ref 15–150)

## 2022-04-10 NOTE — Telephone Encounter (Signed)
I have faxed over referral order to [facility]

## 2022-04-10 NOTE — Telephone Encounter (Signed)
-----   Message from Rita Ohara, MD sent at 04/09/2022  5:02 PM EDT ----- Regarding: Pharmquest referral Please refer this patient to Pharmquest for weight loss trial. Thanks

## 2022-04-28 ENCOUNTER — Encounter: Payer: Self-pay | Admitting: Internal Medicine

## 2022-06-11 ENCOUNTER — Telehealth: Payer: Self-pay | Admitting: Family Medicine

## 2022-06-11 ENCOUNTER — Encounter: Payer: Self-pay | Admitting: Nurse Practitioner

## 2022-06-11 ENCOUNTER — Ambulatory Visit (INDEPENDENT_AMBULATORY_CARE_PROVIDER_SITE_OTHER): Payer: Commercial Managed Care - PPO | Admitting: Nurse Practitioner

## 2022-06-11 VITALS — BP 124/82 | HR 72 | Temp 98.3°F | Wt 286.0 lb

## 2022-06-11 DIAGNOSIS — M25812 Other specified joint disorders, left shoulder: Secondary | ICD-10-CM

## 2022-06-11 DIAGNOSIS — M21922 Unspecified acquired deformity of left upper arm: Secondary | ICD-10-CM | POA: Diagnosis not present

## 2022-06-11 DIAGNOSIS — M25511 Pain in right shoulder: Secondary | ICD-10-CM

## 2022-06-11 DIAGNOSIS — G8929 Other chronic pain: Secondary | ICD-10-CM | POA: Diagnosis not present

## 2022-06-11 DIAGNOSIS — Z6841 Body Mass Index (BMI) 40.0 and over, adult: Secondary | ICD-10-CM

## 2022-06-11 HISTORY — DX: Other specified joint disorders, left shoulder: M25.812

## 2022-06-11 MED ORDER — DICLOFENAC SODIUM 1 % EX GEL: 4.0000 g | Freq: Four times a day (QID) | CUTANEOUS | 3 refills | Status: DC | PRN

## 2022-06-11 MED ORDER — WEGOVY 0.25 MG/0.5ML ~~LOC~~ SOAJ: 0.2500 mg | SUBCUTANEOUS | 0 refills | Status: DC

## 2022-06-11 NOTE — Patient Instructions (Addendum)
It was a pleasure seeing you today! Thank you for trusting me with your care.   I have sent the referral to Dr. Denyse Amass with [facility] on [facility]. I know he does the gel injections and has a great reputation with these.   I am going to see what your chart shows and determine the next tests that would be best. I will let you know what tests we need to look at to try to determine what your white blood cells are doing and we can have you come in to draw that.    If labs were collected today, you will see the results as soon as they become available in MyChart. I will review these labs once I have received all of the results and send you comments and recommendations, if any. If you have specific concerns, we can set up an appointment (virtual or in person) to go over details and come up with a plan together.   If you have received any referrals today, the office where the referral was made will be in contact with you to set up your appointment.   If you have received orders for imaging today, the imaging office will contact you to schedule this. Please note that some imaging requires a prior authorization from insurance, so an order is not a guarantee this will be covered by your insurance, but I want to assure you we will do our best to get this covered and if it is not, you will be notified and we can come up with an alternative plan.   If you take regular prescription medications, please contact your pharmacy for routine refill requests. They will send this directly to [place].  If you were ordered new medication as a part of your examination and treatment today, please contact your pharmacy to determine the status. Many prescriptions require a prior authorization and the pharmacy will contact us to get this started. This may take a few days for approval or denial. If the medication is denied, we will work with you to try alternative medications.   If you have any questions or  concerns, please do not hesitate to contact the office via telephone or MyChart.  MyChart messages are received by the [facility] staff during regular business hours Monday through Friday and we do our best to respond in a timely manner.  If your request requires an appointment, the staff will gladly help set that up so that we have the time dedicated to ensure that your questions are appropriately answered.

## 2022-06-11 NOTE — Progress Notes (Signed)
  Tollie Eth, DNP, AGNP-c [facility] [address] [place], [place] [address] [phone]  Subjective:   Katie Morgan is a 36 y.o. female presents to day for evaluation of: Right Shoulder Pain/Numbness She tells me this has been going on for about a year but is progressively worsening. She had the same issue in her left shoulder several years ago. She is currently unable to raise her arm completely over her head. She has noticed weakness in the arm.   PMH, Medications, and Allergies reviewed and updated in chart as appropriate.   ROS negative except for what is listed in HPI. Objective:  BP 124/82   Pulse 72   Temp 98.3 F (36.8 C)   Wt 286 lb (129.7 kg)   BMI 46.16 kg/m  Physical Exam Vitals and nursing note reviewed.  Constitutional:      Appearance: Normal appearance.  Cardiovascular:     Rate and Rhythm: Normal rate and regular rhythm.     Pulses: Normal pulses.     Heart sounds: Normal heart sounds.  Pulmonary:     Effort: Pulmonary effort is normal.     Breath sounds: Normal breath sounds.  Musculoskeletal:        General: Tenderness present. No swelling or deformity.     Right shoulder: Tenderness present. No crepitus. Decreased range of motion. Decreased strength. Normal pulse.  Skin:    General: Skin is warm and dry.  Neurological:     Mental Status: She is alert.        Assessment & Plan:   Problem List Items Addressed This Visit     Chronic right shoulder pain - Primary    Tenderness, decreased ROM, and decreased strength in the right shoulder progressively worsening for about the last year. Similar pain in the left shoulder has been present in the past which resolved with PT. She has no significant alarm symptoms at this time, which is reassuring. I will send a referral today for sports medicine for further evaluation. Will hold off on order PT at this time, but let SM determine if this is the most appropriate option. Recommend  ice, rest, and voltaren gel to the shoulder to see if this is helpful.       Relevant Medications   diclofenac (VOLTAREN) 75 MG EC tablet   diclofenac Sodium (VOLTAREN) 1 % GEL   Other Relevant Orders   Ambulatory referral to Sports Medicine   BMI 45.0-49.9, adult [facility])    She is suffering with chronic issues with weight gain despite diet and exercise. Given co-morbidities and current weight, we will see if we can get approval for wegovy for weight loss. She is aware that the medication is on backorder and this may take a while to obtain. We will monitor closely.       Relevant Medications   Semaglutide-Weight Management (WEGOVY) 0.25 MG/0.5ML SOAJ   Shoulder joint deformity, left   Relevant Medications   diclofenac Sodium (VOLTAREN) 1 % GEL   Other Relevant Orders   Ambulatory referral to Sports Medicine      Tollie Eth, DNP, AGNP-c 06/15/2022  10:26 PM    History, Medications, Surgery, SDOH, and Family History reviewed and updated as appropriate.

## 2022-06-11 NOTE — Telephone Encounter (Signed)
Received a referral for the patient asking for her to be seen for a gel injection in he right shoulder. The referral states "Patient has a history of gel injection in left shoulder and would like evaluation for another one and same symptoms in the right shoulder."   I called the patient who said that she used to see Dr Prince Rome at [facility] and had a gel injection in her left shoulder because cortisone never worked. Is this something you would be able to do for her or what would you suggest?

## 2022-06-12 NOTE — Telephone Encounter (Signed)
Spoke to pt to clarify. Pt is wanting both shoulder be evaluated and was scheduled for a visit for 1/11.

## 2022-06-12 NOTE — Telephone Encounter (Signed)
I see documentation of you getting cortisone shots in your shoulder with Dr. Prince Rome but I do not see any notes from him where he gave you a gel shot in the shoulder.  Gel shots are not going to be covered under insurance for the shoulder.  I am happy to try 1 in your shoulder but we are going to be unable to get that authorized with your insurance company and is going to be expensive.  I am happy to see you and talk about all the things we can do for your shoulder in more detail if you would like.

## 2022-06-15 NOTE — Assessment & Plan Note (Signed)
She is suffering with chronic issues with weight gain despite diet and exercise. Given co-morbidities and current weight, we will see if we can get approval for wegovy for weight loss. She is aware that the medication is on backorder and this may take a while to obtain. We will monitor closely.

## 2022-06-15 NOTE — Assessment & Plan Note (Signed)
Tenderness, decreased ROM, and decreased strength in the right shoulder progressively worsening for about the last year. Similar pain in the left shoulder has been present in the past which resolved with PT. She has no significant alarm symptoms at this time, which is reassuring. I will send a referral today for sports medicine for further evaluation. Will hold off on order PT at this time, but let SM determine if this is the most appropriate option. Recommend ice, rest, and voltaren gel to the shoulder to see if this is helpful.

## 2022-06-23 ENCOUNTER — Telehealth: Payer: Self-pay | Admitting: Plastic Surgery

## 2022-06-23 NOTE — Telephone Encounter (Signed)
Called pt to see she would like to move forward with her breast reduction and at this time she is on hold with it due to some shoulder issues.  She will contact us when she is ready.

## 2022-06-25 ENCOUNTER — Ambulatory Visit: Payer: Self-pay

## 2022-06-25 ENCOUNTER — Ambulatory Visit: Payer: Commercial Managed Care - PPO | Admitting: Family Medicine

## 2022-06-25 ENCOUNTER — Ambulatory Visit (INDEPENDENT_AMBULATORY_CARE_PROVIDER_SITE_OTHER): Payer: Commercial Managed Care - PPO

## 2022-06-25 VITALS — BP 124/82 | HR 84 | Ht 66.0 in | Wt 288.0 lb

## 2022-06-25 DIAGNOSIS — M25511 Pain in right shoulder: Secondary | ICD-10-CM

## 2022-06-25 DIAGNOSIS — M25512 Pain in left shoulder: Secondary | ICD-10-CM

## 2022-06-25 NOTE — Progress Notes (Signed)
Katie Morgan, am serving as a Education administrator for Dr. Lynne Leader.  Subjective:    CC: Bilateral shoulder pain  HPI: Patient is a 37 year old female presenting with bilateral shoulder pain ongoing for about a year but progressively worsening. Pt has been seen by Dr. Junius Roads and Dr. Erlinda Hong in 2021 for her L shoulder. Patient locates pain to top of her shoulder radiating to the back of the shoulder to the shoulder blade. 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 Now the right shoulder has started to hurt like her left shoulder. Patient was getting the steroid injections in her left shoulder and that helped a lot. ROM in the right shoulder is limited left shoulder is doing alright but is starting to act up again.  After some discussion the subacromial cortisone injection hurt and did not help very much but a intra-articular glenohumeral cortisone/steroid injection did not help.  Radiates: no UE numbness/tingling:no UE weakness:no Aggravates: Treatments tried: Prior PT, steroid injection, diclofenac gel and tablets, and cortisone injections.   Dx imaging: 08/08/2019 L shoulder MRI  05/09/2019 L shoulder x-ray  Pertinent review of Systems: No fevers or chills  Relevant historical information: Iron deficiency anemia.   Objective:    Vitals:   06/25/22 1402  BP: 124/82  Pulse: 84   General: Well Developed, well nourished, and in no acute distress.   MSK: Left shoulder normal-appearing normal motion normal strength.  Mildly positive Hawkins and Neer's test.  Right shoulder: Normal-appearing Motion limited abduction 120 degrees.  Internal rotation limited lumbar spine external rotation is full. Strength 4/5 abduction.  4/5 external rotation.  5/5 internal rotation. Positive Hawkins and Neer's test.  Positive empty can test.  Negative  Yergason's and speeds test.  Lab and Radiology Results  Procedure: Real-time Ultrasound Guided Injection of right shoulder glenohumeral joint posterior approach Device: Philips Affiniti 50G Images permanently stored and available for review in PACS Ultrasound evaluation prior to injection reveals intact appearing rotator cuff tendons with minimal subacromial bursitis. Verbal informed consent obtained.  Discussed risks and benefits of procedure. Warned about infection, bleeding, hyperglycemia damage to structures among others. Patient expresses understanding and agreement Time-out conducted.   Noted no overlying erythema, induration, or other signs of local infection.   Skin prepped in a sterile fashion.   Local anesthesia: Topical Ethyl chloride.   With sterile technique and under real time ultrasound guidance: 40 mg of Kenalog and 2 mL Marcaine injected into glenohumeral joint. Fluid seen entering the joint capsule.   Completed without difficulty   Pain moderately resolved suggesting accurate placement of the medication.   Advised to call if fevers/chills, erythema, induration, drainage, or persistent bleeding.   Images permanently stored and available for review in the ultrasound unit.  Impression: Technically successful ultrasound guided injection.  X-ray images right shoulder obtained today personally and independently interpreted Calcific change at superior portion of humeral head at rotator cuff tendon attachment point consistent with calcific rotator cuff tendinopathy.  No acute fractures are visible. Await formal radiology review      Impression and Recommendations:    Assessment and Plan: 37 y.o. female with bilateral shoulder pain right worse than left.  Pain thought to be due to rotator cuff tendinopathy or perhaps developing frozen shoulder.  She has had good results in the past with glenohumeral  injection and she did not have significant subacromial bursitis on  ultrasound.  I pick a glenohumeral approach for today's injection which worked moderately well.  If she does not get much benefit from it we will turn around and do a subacromial injection.     PDMP not reviewed this encounter. Orders Placed This Encounter  Procedures   [place] LIMITED JOINT SPACE STRUCTURES UP RIGHT(NO LINKED CHARGES)    Standing Status:   Future    Number of Occurrences:   1    Standing Expiration Date:   12/24/2022    Order Specific Question:   Reason for Exam (SYMPTOM  OR DIAGNOSIS REQUIRED)    Answer:   bilateral shoulder pain    Order Specific Question:   Preferred imaging location?    Answer:   [facility]   [place] LIMITED JOINT SPACE STRUCTURES UP RIGHT(NO LINKED CHARGES)    Order Specific Question:   Reason for Exam (SYMPTOM  OR DIAGNOSIS REQUIRED)    Answer:   shoulder pain    Order Specific Question:   Preferred imaging location?    Answer:   [place]   DG Shoulder Right    Standing Status:   Future    Number of Occurrences:   1    Standing Expiration Date:   06/26/2023    Order Specific Question:   Reason for Exam (SYMPTOM  OR DIAGNOSIS REQUIRED)    Answer:   eval shoudler pain    Order Specific Question:   Is patient pregnant?    Answer:   No    Order Specific Question:   Preferred imaging location?    Answer:   [facility]   No orders of the defined types were placed in this encounter.   Discussed warning signs or symptoms. Please see discharge instructions. Patient expresses understanding.   The above documentation has been reviewed and is accurate and complete Lynne Leader, M.D.

## 2022-06-25 NOTE — Patient Instructions (Addendum)
Thank you for coming in today.   Call or go to the ER if you develop a large red swollen joint with extreme pain or oozing puss.    If this does not work we will do it the other way.   Please get an Xray today before you leave

## 2022-06-26 ENCOUNTER — Encounter: Payer: Self-pay | Admitting: Internal Medicine

## 2022-06-29 NOTE — Progress Notes (Signed)
Right shoulder x-ray looks normal to radiology

## 2022-09-30 ENCOUNTER — Other Ambulatory Visit: Payer: Self-pay | Admitting: Physician Assistant

## 2022-09-30 NOTE — Telephone Encounter (Signed)
Refill request last apt 06/11/22.

## 2022-10-06 ENCOUNTER — Ambulatory Visit: Payer: Commercial Managed Care - PPO | Admitting: Nurse Practitioner

## 2022-10-06 ENCOUNTER — Encounter: Payer: Self-pay | Admitting: Nurse Practitioner

## 2022-10-06 VITALS — BP 132/84 | HR 72 | Wt 282.8 lb

## 2022-10-06 DIAGNOSIS — Z6841 Body Mass Index (BMI) 40.0 and over, adult: Secondary | ICD-10-CM

## 2022-10-06 DIAGNOSIS — G8929 Other chronic pain: Secondary | ICD-10-CM

## 2022-10-06 DIAGNOSIS — Z0000 Encounter for general adult medical examination without abnormal findings: Secondary | ICD-10-CM | POA: Diagnosis not present

## 2022-10-06 DIAGNOSIS — M25512 Pain in left shoulder: Secondary | ICD-10-CM

## 2022-10-06 DIAGNOSIS — M25511 Pain in right shoulder: Secondary | ICD-10-CM

## 2022-10-06 DIAGNOSIS — Z803 Family history of malignant neoplasm of breast: Secondary | ICD-10-CM

## 2022-10-06 MED ORDER — DICLOFENAC SODIUM 75 MG PO TBEC: 75.0000 mg | DELAYED_RELEASE_TABLET | Freq: Two times a day (BID) | ORAL | 6 refills | Status: DC

## 2022-10-06 NOTE — Progress Notes (Signed)
  Tollie Eth, DNP, AGNP-c [facility] [address] [place], [place] [address] [phone]  Subjective:   Katie Morgan is a 37 y.o. female presents to day for evaluation of: Breast Lump Randilynn reports a lump on the right breast that she has noticed for the past several months.  She tells me there are times that she is able to detect it but other times she is unable to find the line.  She tells me that the breast "feels different".  She does have a family history of breast cancer which causes increased alarm for her.  She also experiences neck and upper back pain related to her large pendulous breasts.  PMH, Medications, and Allergies reviewed and updated in chart as appropriate.   ROS negative except for what is listed in HPI. Objective:  BP 132/84   Pulse 72   Wt 282 lb 12.8 oz (128.3 kg)   BMI 45.65 kg/m  Physical Exam Vitals and nursing note reviewed.  Constitutional:      Appearance: Normal appearance.  HENT:     Head: Normocephalic.  Eyes:     Pupils: Pupils are equal, round, and reactive to light.  Cardiovascular:     Rate and Rhythm: Normal rate and regular rhythm.  Pulmonary:     Effort: Pulmonary effort is normal.     Breath sounds: Normal breath sounds.  Chest:  Breasts:    Breasts are symmetrical.     Right: Normal.     Left: Normal.  Musculoskeletal:        General: Normal range of motion.  Lymphadenopathy:     Upper Body:     Right upper body: No supraclavicular or axillary adenopathy.     Left upper body: No supraclavicular or axillary adenopathy.  Skin:    General: Skin is warm and dry.     Capillary Refill: Capillary refill takes less than 2 seconds.     Findings: No erythema, lesion or rash.  Neurological:     General: No focal deficit present.     Mental Status: She is alert.  Psychiatric:        Mood and Affect: Mood normal.           Assessment & Plan:   Problem List Items Addressed This Visit     Family  history of breast cancer in first degree relative - Primary    Self detected lump in right breast with family history of breast cancer in first-degree relative.  Unable to palpate lump today, however, given her family history and noted changes to the breast tissue we will send referral today for diagnostic mammogram for evaluation.  No alarm symptoms are present at this time which is reassuring.  We will make changes to the plan of care as necessary based on findings.      Relevant Orders   MM 3D SCREENING MAMMOGRAM BILATERAL BREAST (Completed)   Other Visit Diagnoses     Chronic pain of both shoulders       Relevant Medications   diclofenac (VOLTAREN) 75 MG EC tablet   Health care maintenance       Relevant Orders   Ambulatory referral to Obstetrics / Gynecology         Tollie Eth, DNP, AGNP-c 10/26/2022  6:54 PM    History, Medications, Surgery, SDOH, and Family History reviewed and updated as appropriate.

## 2022-10-06 NOTE — Patient Instructions (Signed)
I have sent the mammogram order to the breast center. They will call you to schedule. I have also sent the referral for gynecology. They will also call you to set up an appointment.

## 2022-10-08 ENCOUNTER — Ambulatory Visit
Admission: RE | Admit: 2022-10-08 | Discharge: 2022-10-08 | Disposition: A | Discharge status: Home / Self Care | Payer: Commercial Managed Care - PPO | Source: Ambulatory Visit | Attending: Nurse Practitioner | Admitting: Nurse Practitioner

## 2022-10-08 DIAGNOSIS — Z803 Family history of malignant neoplasm of breast: Secondary | ICD-10-CM

## 2022-10-26 DIAGNOSIS — Z803 Family history of malignant neoplasm of breast: Secondary | ICD-10-CM | POA: Insufficient documentation

## 2022-10-26 NOTE — Assessment & Plan Note (Signed)
Self detected lump in right breast with family history of breast cancer in first-degree relative.  Unable to palpate lump today, however, given her family history and noted changes to the breast tissue we will send referral today for diagnostic mammogram for evaluation.  No alarm symptoms are present at this time which is reassuring.  We will make changes to the plan of care as necessary based on findings.

## 2022-11-10 ENCOUNTER — Encounter: Payer: Commercial Managed Care - PPO | Admitting: Physician Assistant

## 2022-12-11 ENCOUNTER — Encounter: Payer: Commercial Managed Care - PPO | Admitting: Obstetrics and Gynecology

## 2022-12-31 ENCOUNTER — Encounter: Payer: Commercial Managed Care - PPO | Admitting: Obstetrics and Gynecology

## 2023-01-05 ENCOUNTER — Other Ambulatory Visit: Payer: Self-pay | Admitting: Nurse Practitioner

## 2023-01-05 ENCOUNTER — Telehealth: Payer: Self-pay | Admitting: Nurse Practitioner

## 2023-01-05 DIAGNOSIS — Z6841 Body Mass Index (BMI) 40.0 and over, adult: Secondary | ICD-10-CM

## 2023-01-05 MED ORDER — WEGOVY 0.25 MG/0.5ML ~~LOC~~ SOAJ: 0.2500 mg | SUBCUTANEOUS | 0 refills | Status: DC

## 2023-01-05 NOTE — Telephone Encounter (Signed)
Pt left message she was unable to get started on the Wegovy months ago but now pharmacy has it in stock and she would like to start it now.  Please resend in starter dose

## 2023-01-07 ENCOUNTER — Telehealth: Payer: Self-pay | Admitting: Nurse Practitioner

## 2023-01-07 NOTE — Telephone Encounter (Signed)
P.A. WEGOVY 

## 2023-01-13 NOTE — Telephone Encounter (Signed)
P.A. was denied, needs medical records.  I resubmitted P.A. with medical records & was approved til 08/11/23, sent mychart message

## 2023-01-17 ENCOUNTER — Other Ambulatory Visit: Payer: Self-pay | Admitting: Family Medicine

## 2023-01-17 DIAGNOSIS — Z6841 Body Mass Index (BMI) 40.0 and over, adult: Secondary | ICD-10-CM

## 2023-01-19 MED ORDER — WEGOVY 0.25 MG/0.5ML ~~LOC~~ SOAJ
0.2500 mg | SUBCUTANEOUS | 0 refills | Status: DC
Start: 2023-01-19 — End: 2023-04-22

## 2023-02-11 ENCOUNTER — Encounter: Payer: Self-pay | Admitting: Nurse Practitioner

## 2023-02-11 ENCOUNTER — Ambulatory Visit: Payer: Commercial Managed Care - PPO | Admitting: Nurse Practitioner

## 2023-02-11 VITALS — BP 124/78 | HR 78 | Wt 285.6 lb

## 2023-02-11 DIAGNOSIS — S3141XA Laceration without foreign body of vagina and vulva, initial encounter: Secondary | ICD-10-CM | POA: Insufficient documentation

## 2023-02-11 HISTORY — DX: Laceration without foreign body of vagina and vulva, initial encounter: S31.41XA

## 2023-02-11 MED ORDER — FLUCONAZOLE 150 MG PO TABS
150.0000 mg | ORAL_TABLET | Freq: Once | ORAL | 0 refills | Status: AC
Start: 2023-02-11 — End: 2023-02-11

## 2023-02-11 MED ORDER — DOXYCYCLINE HYCLATE 100 MG PO TABS
100.0000 mg | ORAL_TABLET | Freq: Two times a day (BID) | ORAL | 0 refills | Status: DC
Start: 2023-02-11 — End: 2023-03-08

## 2023-02-11 NOTE — Patient Instructions (Signed)
I have sent in antibiotics and the diflucan for you. This looks good and is healing well. Please let me know if you have any concerns with more bleeding or pain.
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
## 2023-02-11 NOTE — Assessment & Plan Note (Signed)
Examination shows small healing shallow laceration to the vaginal wall with no signs of infection or bleeding present at this time. Will treat with prophylactic antibiotics today. Recommend follow-up exam if bleeding returns or there is evidence of odor, discharge, or pain.

## 2023-03-01 ENCOUNTER — Other Ambulatory Visit: Payer: Self-pay

## 2023-03-01 ENCOUNTER — Emergency Department (HOSPITAL_COMMUNITY)
Admission: EM | Admit: 2023-03-01 | Discharge: 2023-03-02 | Disposition: A | Discharge status: Home / Self Care | Payer: Commercial Managed Care - PPO | Attending: Emergency Medicine | Admitting: Emergency Medicine

## 2023-03-01 ENCOUNTER — Encounter (HOSPITAL_COMMUNITY): Payer: Self-pay | Admitting: Emergency Medicine

## 2023-03-01 DIAGNOSIS — X58XXXA Exposure to other specified factors, initial encounter: Secondary | ICD-10-CM | POA: Insufficient documentation

## 2023-03-01 DIAGNOSIS — S86912A Strain of unspecified muscle(s) and tendon(s) at lower leg level, left leg, initial encounter: Secondary | ICD-10-CM | POA: Insufficient documentation

## 2023-03-01 DIAGNOSIS — T148XXA Other injury of unspecified body region, initial encounter: Secondary | ICD-10-CM

## 2023-03-01 DIAGNOSIS — S8992XA Unspecified injury of left lower leg, initial encounter: Secondary | ICD-10-CM | POA: Diagnosis present

## 2023-03-01 NOTE — ED Triage Notes (Signed)
Pt c/o left leg pain that shoots down from her hip.

## 2023-03-02 ENCOUNTER — Emergency Department (HOSPITAL_COMMUNITY): Payer: Commercial Managed Care - PPO

## 2023-03-02 MED ORDER — CYCLOBENZAPRINE HCL 5 MG PO TABS: 5.0000 mg | ORAL_TABLET | Freq: Three times a day (TID) | ORAL | 0 refills | Status: DC | PRN

## 2023-03-02 MED ORDER — KETOROLAC TROMETHAMINE 15 MG/ML IJ SOLN
15.0000 mg | Freq: Once | INTRAMUSCULAR | Status: AC
  Administered 2023-03-02: 15 mg via INTRAMUSCULAR
  Filled 2023-03-02: qty 1

## 2023-03-02 NOTE — ED Provider Notes (Signed)
[place] EMERGENCY DEPARTMENT AT [facility] Provider Note   CSN: [id number] Arrival date & time: 03/01/23  1937     History  Chief Complaint  Patient presents with   Leg Pain    Katie Morgan is a 37 y.o. female, no pertinent past medical history, presents to the ED secondary to left leg pain. Has been going on for has been going on for the last 4 days.  She states she has left leg pain, on the side of her left leg, has been going on for the last 4 days, is pressurized and stabbing, worse when bearing weight.  Denies any swelling of her lower extremities, no use of birth control, no recent trauma.  Just came out of the blue.  Has not tried anything for the pain.  States her friends told her she may have sciatica.  Pain does sometimes radiate to buttock.  Home Medications Prior to Admission medications   Medication Sig Start Date End Date Taking? Authorizing Provider  cyclobenzaprine (FLEXERIL) 5 MG tablet Take 1 tablet (5 mg total) by mouth 3 (three) times daily as needed. 03/02/23  Yes Porschea Borys L, PA  diclofenac (VOLTAREN) 75 MG EC tablet Take 1 tablet (75 mg total) by mouth 2 (two) times daily. 10/06/22   Tollie Eth, NP  diclofenac Sodium (VOLTAREN) 1 % GEL Apply 4 g topically 4 (four) times daily as needed. 06/11/22   Tollie Eth, NP  doxycycline (VIBRA-TABS) 100 MG tablet Take 1 tablet (100 mg total) by mouth 2 (two) times daily. 02/11/23   Tollie Eth, NP  Semaglutide-Weight Management (WEGOVY) 0.25 MG/0.5ML SOAJ Inject 0.25 mg into the skin once a week. 01/19/23   Tysinger, Kermit Balo, PA-C  Vitamin D, Ergocalciferol, (DRISDOL) 1.25 MG (50000 UNIT) CAPS capsule TAKE 1 CAPSULE BY MOUTH EVERY 7 DAYS 09/30/22   Early, Sung Amabile, NP      Allergies    Patient has no known allergies.    Review of Systems   Review of Systems  Musculoskeletal:  Negative for back pain.       +L leg pain    Physical Exam Updated Vital Signs BP (!) 156/99 (BP Location: Left Arm)    Pulse 85   Temp 98.9 F (37.2 C) (Oral)   Resp 18   Ht 5\' 6"  (1.676 m)   Wt 130 kg   LMP 03/01/2023   SpO2 98%   BMI 46.26 kg/m  Physical Exam Vitals and nursing note reviewed.  Constitutional:      General: She is not in acute distress.    Appearance: She is well-developed.  HENT:     Head: Normocephalic and atraumatic.  Eyes:     Conjunctiva/sclera: Conjunctivae normal.  Cardiovascular:     Rate and Rhythm: Normal rate and regular rhythm.     Heart sounds: No murmur heard. Pulmonary:     Effort: Pulmonary effort is normal. No respiratory distress.     Breath sounds: Normal breath sounds.  Abdominal:     Palpations: Abdomen is soft.     Tenderness: There is no abdominal tenderness.  Musculoskeletal:        General: No swelling.     Cervical back: Neck supple.     Comments: Tenderness to palpation of mid femur, on the lateral aspect.  As well as tenderness to palpation of the IT band.  Pain worse with straight leg raise.  No pain to the buttocks, or posterior aspect of the  leg.  No swelling of the leg.  Positive dorsalis pedis pulse.  No rash  Skin:    General: Skin is warm and dry.     Capillary Refill: Capillary refill takes less than 2 seconds.  Neurological:     Mental Status: She is alert.  Psychiatric:        Mood and Affect: Mood normal.     ED Results / Procedures / Treatments   Labs (all labs ordered are listed, but only abnormal results are displayed) Labs Reviewed - No data to display  EKG None  Radiology DG Femur Min 2 Views Left  Result Date: 03/02/2023 CLINICAL DATA:  Left hip pain for several days, initial encounter EXAM: LEFT FEMUR 2 VIEWS COMPARISON:  None Available. FINDINGS: Mild degenerative changes of left hip joint are seen. Femur is otherwise within normal limits. No soft tissue abnormality is seen. IMPRESSION: Mild degenerative change without acute abnormality. Electronically Signed   By: Alcide Clever M.D.   On: 03/02/2023 02:21     Procedures Procedures    Medications Ordered in ED Medications  ketorolac (TORADOL) 15 MG/ML injection 15 mg (15 mg Intramuscular Given 03/02/23 0141)    ED Course/ Medical Decision Making/ A&P                                 Medical Decision Making Patient is a 37 year old female, here for left lateral hip pain, that is been going on for the last few days.  States that it feels painful and tight, and worse with ambulation.  Denies any swelling in the leg.  No redness.  Will obtain x-ray given the tenderness to palpation, and is point tenderness there is no posterior tenderness to palpation thus I think unlikely to be a DVT.  I am suspicious that this may be an IT band issue versus sciatica.  Amount and/or Complexity of Data Reviewed Radiology: ordered.    Details: X-ray unremarkable Discussion of management or test interpretation with external provider(s): See above, patient has no risk factors for DVT, no history of a DVT, is on oral contraceptives, denies any trauma.  Area is not swollen, and she has no posterior tenderness to palpation, it all appears to be lateral.  Suspicious that this may be a strain, versus IT band issue.  We will have her follow-up with her primary care doctor, and physical therapy for further evaluation.  Muscle relaxers prescribed  Risk Prescription drug management.  Final Clinical Impression(s) / ED Diagnoses Final diagnoses:  Muscle strain    Rx / DC Orders ED Discharge Orders          Ordered    cyclobenzaprine (FLEXERIL) 5 MG tablet  3 times daily PRN        03/02/23 0235              Amandalee Lacap, Harley Alto, PA 03/02/23 0306    Nira Conn, MD 03/03/23 1313

## 2023-03-02 NOTE — ED Notes (Signed)
Patient taken to xray at this time.

## 2023-03-02 NOTE — Discharge Instructions (Addendum)
Please follow-up with your primary care doctor, discussed possibly having physical therapy, help work with you, for your possible sciatica.  You should also take ibuprofen 800 mg 3 times a day as needed for the pain control, as well as a muscle relaxer have sent you.  Return to the ER if you have new swelling of your leg, rash, or loss of pulse in your foot.

## 2023-03-02 NOTE — ED Notes (Signed)
Patient reports pain that radiates down L leg.  Pain started on Thursday

## 2023-03-08 ENCOUNTER — Telehealth: Payer: Self-pay | Admitting: Internal Medicine

## 2023-03-08 ENCOUNTER — Ambulatory Visit: Payer: Commercial Managed Care - PPO | Admitting: Medical

## 2023-03-08 VITALS — BP 142/88 | HR 82 | Wt 284.0 lb

## 2023-03-08 DIAGNOSIS — Z6841 Body Mass Index (BMI) 40.0 and over, adult: Secondary | ICD-10-CM | POA: Diagnosis not present

## 2023-03-08 DIAGNOSIS — R030 Elevated blood-pressure reading, without diagnosis of hypertension: Secondary | ICD-10-CM

## 2023-03-08 DIAGNOSIS — M5430 Sciatica, unspecified side: Secondary | ICD-10-CM

## 2023-03-08 DIAGNOSIS — D509 Iron deficiency anemia, unspecified: Secondary | ICD-10-CM

## 2023-03-08 MED ORDER — WEGOVY 1 MG/0.5ML ~~LOC~~ SOAJ: 1.0000 mg | SUBCUTANEOUS | 0 refills | Status: DC

## 2023-03-08 MED ORDER — WEGOVY 0.5 MG/0.5ML ~~LOC~~ SOAJ: 0.5000 mg | SUBCUTANEOUS | 0 refills | Status: DC

## 2023-03-08 NOTE — Progress Notes (Signed)
I have put in referral to PT

## 2023-03-08 NOTE — Telephone Encounter (Signed)
Pt is finishing up wegovy. 025mg  an wants to know when she should titrate up.

## 2023-03-08 NOTE — Addendum Note (Signed)
Addended by: Herminio Commons A on: 03/08/2023 03:06 PM   Modules accepted: Orders

## 2023-03-08 NOTE — Progress Notes (Signed)
Subjective:  Katie Morgan is a 37 y.o. female who presents for Chief Complaint  Patient presents with   ER hospital follow-up    ER follow-up, doing much better with Sciatica pain     Here for emergency dept follow up.  Went to ED last week for sciatica.   Used rest, ice.   Was prescribed medication but that made her nauseated.  Had never had sciatica before.  Currently sciatica is resolved.  Had about 5 days of symptoms.  Went to dentist 6 days ago and BP was elevated.  She notes no history of hypertension.  She was using her mom's cuff to check blood pressure at home but it was not sized correctly for her arm.  She has been on Wegovy at the low dose but has questions about this.  Not sure of how long she needs to be on this.  She was initially prescribed this over a year ago but has had issues getting refills   No other aggravating or relieving factors.    No other c/o.  Past Medical History:  Diagnosis Date   Anemia    Morbid obesity (HCC)    Severe obesity (BMI >= 40) (HCC) 04/06/2016   Current Outpatient Medications on File Prior to Visit  Medication Sig Dispense Refill   Semaglutide-Weight Management (WEGOVY) 0.25 MG/0.5ML SOAJ Inject 0.25 mg into the skin once a week. 2 mL 0   Vitamin D, Ergocalciferol, (DRISDOL) 1.25 MG (50000 UNIT) CAPS capsule TAKE 1 CAPSULE BY MOUTH EVERY 7 DAYS 12 capsule 3   No current facility-administered medications on file prior to visit.     The following portions of the patient's history were reviewed and updated as appropriate: allergies, current medications, past family history, past medical history, past social history, past surgical history and problem list.  ROS Otherwise as in subjective above   Objective: BP (!) 142/88   Pulse 82   Wt 284 lb (128.8 kg)   LMP 03/01/2023   BMI 45.84 kg/m   Wt Readings from Last 3 Encounters:  03/08/23 284 lb (128.8 kg)  03/01/23 286 lb 9.6 oz (130 kg)  02/11/23 285 lb 9.6 oz (129.5 kg)    BP Readings from Last 3 Encounters:  03/08/23 (!) 142/88  03/02/23 (!) 156/99  02/11/23 124/78    General appearance: alert, no distress, well developed, well nourished Neck: supple, no lymphadenopathy, no thyromegaly, no masses Heart: RRR, normal S1, S2, no murmurs Lungs: CTA bilaterally, no wheezes, rhonchi, or rales Back nontender with normal range of motion  pulses: 2+ radial pulses, 2+ pedal pulses, normal cap refill Ext: no edema    Assessment: Encounter Diagnoses  Name Primary?   Elevated blood pressure reading in office without diagnosis of hypertension Yes   BMI 45.0-49.9, adult (HCC)    Sciatica, unspecified laterality    Iron deficiency anemia, unspecified iron deficiency anemia type      Plan: Elevated blood pressure without diagnosis of hypertension Counseled on diet, exercise, need to lose weight.  Looking back her blood pressures historically have been normal.  She probably had transient elevated blood pressure due to sciatica pain recently.  BMI 45-counseled on proper use of Wegovy, potential risk, potential long-term use.  Counseled on diet and exercise.  Continue efforts to lose weight.  Begin the next higher dose 0.5 mg for 1 month then 1 mg after that.  Follow-up within the next 1/2 months here  Sciatica-I reviewed her recent emergency department notes.  Sciatica  has resolved.  Referral to physical therapy for consult on stretching and injury prevention at her request  Anemia-follow-up soon for physical and recheck on labs.  She declines recheck CBC today.  Katie Morgan was seen today for er hospital follow-up.  Diagnoses and all orders for this visit:  Elevated blood pressure reading in office without diagnosis of hypertension  BMI 45.0-49.9, adult (HCC)  Sciatica, unspecified laterality  Iron deficiency anemia, unspecified iron deficiency anemia type  Other orders -     Semaglutide-Weight Management (WEGOVY) 0.5 MG/0.5ML SOAJ; Inject 0.5 mg into  the skin once a week. -     Semaglutide-Weight Management (WEGOVY) 1 MG/0.5ML SOAJ; Inject 1 mg into the skin once a week.    Follow up: 1/2 months for physical and med check with her PCP here

## 2023-03-15 ENCOUNTER — Ambulatory Visit: Payer: Commercial Managed Care - PPO | Admitting: Rehabilitative and Restorative Service Providers"

## 2023-03-26 ENCOUNTER — Ambulatory Visit: Payer: Commercial Managed Care - PPO | Admitting: Physical Therapy

## 2023-04-22 ENCOUNTER — Encounter: Payer: Self-pay | Admitting: Nurse Practitioner

## 2023-04-22 ENCOUNTER — Ambulatory Visit: Payer: Commercial Managed Care - PPO | Admitting: Nurse Practitioner

## 2023-04-22 VITALS — BP 132/82 | HR 76 | Wt 282.6 lb

## 2023-04-22 DIAGNOSIS — Z6841 Body Mass Index (BMI) 40.0 and over, adult: Secondary | ICD-10-CM

## 2023-04-22 DIAGNOSIS — R030 Elevated blood-pressure reading, without diagnosis of hypertension: Secondary | ICD-10-CM

## 2023-04-22 DIAGNOSIS — M5430 Sciatica, unspecified side: Secondary | ICD-10-CM

## 2023-04-22 NOTE — Progress Notes (Signed)
Katie Clamp, DNP, AGNP-c [facility]  [address] [place], [place] [address] [phone]  ESTABLISHED PATIENT- Chronic Health and/or Follow-Up Visit  Blood pressure 132/82, pulse 76, weight 282 lb 9.6 oz (128.2 kg), last menstrual period 04/03/2023.    Katie Morgan is a 37 y.o. year old female presenting today for evaluation and management of chronic conditions.  History of Present Illness The patient presents with a history of hypertension and recent elevated blood pressure readings, which led to an emergency room visit. She reports that the elevated blood pressure was due to sciatic pain. The patient is currently on Wegovy for weight loss and reports tolerating the medication well without any significant side effects. She has noticed a weight loss of approximately 8 pounds since starting the medication. However, she reports an unusual sensation of pain and breathlessness shortly after administering the injection, which resolves spontaneously.  The patient also reports chronic constipation, which she manages with over-the-counter iron supplements from [employer]. She notes that these supplements have a beneficial side effect of promoting bowel movements.  All ROS negative with exception of what is listed above.   PHYSICAL EXAM Physical Exam Vitals and nursing note reviewed.  Constitutional:      General: She is not in acute distress.    Appearance: Normal appearance.  HENT:     Head: Normocephalic.  Eyes:     Conjunctiva/sclera: Conjunctivae normal.  Neck:     Vascular: No carotid bruit.  Cardiovascular:     Rate and Rhythm: Normal rate and regular rhythm.     Pulses: Normal pulses.     Heart sounds: Murmur heard.  Pulmonary:     Effort: Pulmonary effort is normal.     Breath sounds: Normal breath sounds.  Abdominal:     General: There is no distension.     Palpations: Abdomen is soft.     Tenderness: There is no abdominal tenderness.   Musculoskeletal:        General: Normal range of motion.  Lymphadenopathy:     Cervical: No cervical adenopathy.  Skin:    General: Skin is warm and dry.     Capillary Refill: Capillary refill takes less than 2 seconds.  Neurological:     General: No focal deficit present.     Mental Status: She is alert and oriented to person, place, and time.  Psychiatric:        Mood and Affect: Mood normal.      PLAN Problem List Items Addressed This Visit     BMI 45.0-49.9, adult (HCC) - Primary    On Wegovy for weight loss, with an 8 pound reduction. Occasional back pain post-injection and mild constipation reported. Prefers to continue [place]. Working on diet and exercise management with low carbohydrate options and attempting at least 20 minutes of cardiovascular activity a day.  - Continue Wegovy, increase to 1 mg - Monitor for side effects - Encourage weight loss efforts - Consider protein supplements for nutrition      Elevated blood pressure reading without diagnosis of hypertension    Blood pressure well-controlled. Previous elevations likely due to acute pain from sciatica. No symptoms of hypertensive urgency or emergency. - Monitor blood pressure - Follow up as needed      Sciatica    Recent exacerbation managed,. Symptoms currently controlled. - Continue current management - Follow up if symptoms worsen       Return in about 6 months (around 10/20/2023) for Med Management 30.  [name],  DNP, AGNP-c

## 2023-04-22 NOTE — Patient Instructions (Addendum)
WEIGHT LOSS PLANNING Your progress today shows:     04/22/2023    3:38 PM 03/08/2023   11:39 AM 03/02/2023    2:51 AM  Vitals with BMI  Weight 282 lbs 10 oz 284 lbs   BMI  45.86   Systolic 132 142 562  Diastolic 82 88 99  Pulse 76 82 85    For best management of weight, it is vital to balance intake versus output. This means the number of calories burned per day must be less than the calories you take in with food and drink.   I recommend trying to follow a diet with the following: Calories: 1200-1500 calories per day Carbohydrates: 150-180 grams of carbohydrates per day  Why: Gives your body enough "quick fuel" for cells to maintain normal function without sending them into starvation mode.  Protein: At least 90 grams of protein per day- 30 grams with each meal Why: Protein takes longer and uses more energy than carbohydrates to break down for fuel. The carbohydrates in your meals serves as quick energy sources and proteins help use some of that extra quick energy to break down to produce long term energy. This helps you not feel hungry as quickly and protein breakdown burns calories.  Water: Drink AT LEAST 64 ounces of water per day  Why: Water is essential to healthy metabolism. Water helps to fill the stomach and keep you fuller longer. Water is required for healthy digestion and filtering of waste in the body.  Fat: Limit fats in your diet- when choosing fats, choose foods with lower fats content such as lean meats (chicken, fish, [place]).  Why: Increased fat intake leads to storage "for later". Once you burn your carbohydrate energy, your body goes into fat and protein breakdown mode to help you loose weight.  Cholesterol: Fats and oils that are LIQUID at room temperature are best. Choose vegetable oils (olive oil, avocado oil, nuts). Avoid fats that are SOLID at room temperature (animal fats, processed meats). Healthy fats are often found in whole grains, beans, nuts, seeds, and  berries.  Why: Elevated cholesterol levels lead to build up of cholesterol on the inside of your blood vessels. This will eventually cause the blood vessels to become hard and can lead to high blood pressure and damage to your organs. When the blood flow is reduced, but the pressure is high from cholesterol buildup, parts of the cholesterol can break off and form clots that can go to the brain or heart leading to a stroke or heart attack.  Fiber: Increase amount of SOLUBLE the fiber in your diet. This helps to fill you up, lowers cholesterol, and helps with digestion. Some foods high in soluble fiber are oats, peas, beans, apples, carrots, barley, and citrus fruits.   Why: Fiber fills you up, helps remove excess cholesterol, and aids in healthy digestion which are all very important in weight management.   I recommend the following as a minimum activity routine: Purposeful walk or other physical activity at least 20 minutes every single day. This means purposefully taking a walk, jog, bike, swim, treadmill, elliptical, dance, etc.  This activity should be ABOVE your normal daily activities, such as walking at work. Goal exercise should be at least 150 minutes a week- work your way up to this.   Heart Rate: Your maximum exercise heart rate should be 220 - Your Age in Years. When exercising, get your heart rate up, but avoid going over the maximum targeted  heart rate.  60-70% of your maximum heart rate is where you tend to burn the most fat. To find this number:  220 - Age In Years= Max HR  Max HR x 0.6 (or 0.7) = Fat Burning HR The Fat Burning HR is your goal heart rate while working out to burn the most fat.  NEVER exercise to the point your feel lightheaded, weak, nauseated, dizzy. If you experience ANY of these symptoms- STOP exercise! Allow yourself to cool down and your heart rate to come down. Then restart slower next time.  If at ANY TIME you feel chest pain or chest pressure during  exercise, STOP IMMEDIATELY and seek medical attention.

## 2023-04-29 DIAGNOSIS — G5701 Lesion of sciatic nerve, right lower limb: Secondary | ICD-10-CM | POA: Insufficient documentation

## 2023-04-29 DIAGNOSIS — R030 Elevated blood-pressure reading, without diagnosis of hypertension: Secondary | ICD-10-CM | POA: Insufficient documentation

## 2023-04-29 DIAGNOSIS — M5430 Sciatica, unspecified side: Secondary | ICD-10-CM | POA: Insufficient documentation

## 2023-04-29 DIAGNOSIS — I10 Essential (primary) hypertension: Secondary | ICD-10-CM | POA: Insufficient documentation

## 2023-04-29 NOTE — Assessment & Plan Note (Signed)
Blood pressure well-controlled. Previous elevations likely due to acute pain from sciatica. No symptoms of hypertensive urgency or emergency. - Monitor blood pressure - Follow up as needed

## 2023-04-29 NOTE — Assessment & Plan Note (Signed)
Recent exacerbation managed,. Symptoms currently controlled. - Continue current management - Follow up if symptoms worsen

## 2023-04-29 NOTE — Assessment & Plan Note (Signed)
On Wegovy for weight loss, with an 8 pound reduction. Occasional back pain post-injection and mild constipation reported. Prefers to continue [place]. Working on diet and exercise management with low carbohydrate options and attempting at least 20 minutes of cardiovascular activity a day.  - Continue Wegovy, increase to 1 mg - Monitor for side effects - Encourage weight loss efforts - Consider protein supplements for nutrition

## 2023-05-03 ENCOUNTER — Encounter: Payer: Self-pay | Admitting: Nurse Practitioner

## 2023-05-05 ENCOUNTER — Ambulatory Visit: Payer: Commercial Managed Care - PPO | Admitting: Nurse Practitioner

## 2023-05-11 ENCOUNTER — Encounter: Payer: Self-pay | Admitting: Nurse Practitioner

## 2023-05-11 ENCOUNTER — Ambulatory Visit: Payer: Commercial Managed Care - PPO | Admitting: Nurse Practitioner

## 2023-05-11 VITALS — BP 120/82 | HR 68 | Wt 276.8 lb

## 2023-05-11 DIAGNOSIS — Z0000 Encounter for general adult medical examination without abnormal findings: Secondary | ICD-10-CM | POA: Insufficient documentation

## 2023-05-11 DIAGNOSIS — E785 Hyperlipidemia, unspecified: Secondary | ICD-10-CM | POA: Diagnosis not present

## 2023-05-11 DIAGNOSIS — Z6841 Body Mass Index (BMI) 40.0 and over, adult: Secondary | ICD-10-CM | POA: Diagnosis not present

## 2023-05-11 DIAGNOSIS — Z113 Encounter for screening for infections with a predominantly sexual mode of transmission: Secondary | ICD-10-CM

## 2023-05-11 HISTORY — DX: Encounter for screening for infections with a predominantly sexual mode of transmission: Z11.3

## 2023-05-11 MED ORDER — WEGOVY 2.4 MG/0.75ML ~~LOC~~ SOAJ
2.4000 mg | SUBCUTANEOUS | 0 refills | Status: DC
Start: 2023-05-11 — End: 2023-06-28

## 2023-05-11 MED ORDER — WEGOVY 1.7 MG/0.75ML ~~LOC~~ SOAJ
1.7000 mg | SUBCUTANEOUS | 1 refills | Status: DC
Start: 2023-05-11 — End: 2023-06-28

## 2023-05-11 NOTE — Assessment & Plan Note (Signed)
Reports irritation without odor or new sexual partners. Requests comprehensive STI screening including vaginal swab and blood tests for HIV and syphilis. Discussed transmission risks between female partners and the importance of regular screening. - Perform vaginal swab - Order blood tests for HIV and syphilis - Consider standing order for future STI screenings every three months.

## 2023-05-11 NOTE — Progress Notes (Signed)
Tollie Eth, DNP, AGNP-c [facility] [address] [place], [place] [address] [phone]   ACUTE VISIT- ESTABLISHED PATIENT  Blood pressure 120/82, pulse 68, weight 276 lb 12.8 oz (125.6 kg), last menstrual period 04/03/2023.  Subjective:  HPI Katie Morgan is a 37 y.o. female presents to day for evaluation of acute concern(s).   The patient presents for a refill of her Wegovy medication and a dose increase. She is currently on a 1.0 dose and wishes to increase to 1.7. She reports no issues with the current dose and is open to further increases if necessary. She also mentions a previous issue with her insurance when two doses were sent at the same time, causing a delay in receiving her medication.  In addition to her [facility] medication, the patient reports a recent change in her sexual behavior, having a new sexual partner. She expresses concern about potential sexually transmitted infections and requests a full panel of tests, including HIV and syphilis. She reports no current vaginal symptoms or odors.  She is otherwise engaged in regular activities and expresses satisfaction with her current health status.  ROS negative except for what is listed in HPI. History, Medications, Surgery, SDOH, and Family History reviewed and updated as appropriate.  Objective:  Physical Exam Vitals and nursing note reviewed.  Constitutional:      Appearance: Normal appearance. She is obese.  Cardiovascular:     Rate and Rhythm: Normal rate and regular rhythm.     Pulses: Normal pulses.     Heart sounds: Normal heart sounds.  Pulmonary:     Effort: Pulmonary effort is normal.     Breath sounds: Normal breath sounds.  Abdominal:     General: Bowel sounds are normal. There is no distension.     Palpations: Abdomen is soft.     Tenderness: There is no abdominal tenderness. There is no guarding.  Lymphadenopathy:     Cervical: No cervical adenopathy.  Skin:    Capillary  Refill: Capillary refill takes less than 2 seconds.  Neurological:     Mental Status: She is alert.          Assessment & Plan:   Problem List Items Addressed This Visit     BMI 45.0-49.9, adult (HCC)    Currently on Wegovy for weight management, requesting an increase in dosage from 1.7 mg to 2.4 mg. Discussed insurance implications and potential cost savings of a three-month supply. Explained that some insurances require monthly dosage increases unless the current dose is not tolerated. - Send prescription for Wegovy 1.7 mg for 28 days - Send prescription for Wegovy 2.4 mg for 56 days if tolerated - Monitor for side effects and adjust dosage as needed      Relevant Medications   Semaglutide-Weight Management (WEGOVY) 1.7 MG/0.75ML SOAJ   Semaglutide-Weight Management (WEGOVY) 2.4 MG/0.75ML SOAJ   Routine screening for STI (sexually transmitted infection) - Primary    Reports irritation without odor or new sexual partners. Requests comprehensive STI screening including vaginal swab and blood tests for HIV and syphilis. Discussed transmission risks between female partners and the importance of regular screening. - Perform vaginal swab - Order blood tests for HIV and syphilis - Consider standing order for future STI screenings every three months.      Relevant Orders   NuSwab Vaginitis Plus (VG+)   RPR   HIV Antibody (routine testing w rflx)   Hepatitis C antibody   Elevated lipids   Relevant Medications  Semaglutide-Weight Management (WEGOVY) 1.7 MG/0.75ML SOAJ   Semaglutide-Weight Management (WEGOVY) 2.4 MG/0.75ML SOAJ      Tollie Eth, DNP, AGNP-c

## 2023-05-11 NOTE — Assessment & Plan Note (Addendum)
Currently on Wegovy for weight management, requesting an increase in dosage from 1.7 mg to 2.4 mg. Discussed insurance implications and potential cost savings of a three-month supply. Explained that some insurances require monthly dosage increases unless the current dose is not tolerated. - Send prescription for Wegovy 1.7 mg for 28 days - Send prescription for Wegovy 2.4 mg for 56 days if tolerated - Monitor for side effects and adjust dosage as needed

## 2023-05-12 LAB — HIV ANTIBODY (ROUTINE TESTING W REFLEX): HIV Screen 4th Generation wRfx: NONREACTIVE

## 2023-05-12 LAB — HEPATITIS C ANTIBODY: Hep C Virus Ab: NONREACTIVE

## 2023-05-12 LAB — RPR: RPR Ser Ql: NONREACTIVE

## 2023-05-13 LAB — NUSWAB VAGINITIS PLUS (VG+)
Atopobium vaginae: HIGH {score} — AB
Candida albicans, NAA: NEGATIVE
Candida glabrata, NAA: NEGATIVE
Chlamydia trachomatis, NAA: NEGATIVE
Megasphaera 1: HIGH {score} — AB
Neisseria gonorrhoeae, NAA: NEGATIVE
Trich vag by NAA: NEGATIVE

## 2023-05-17 ENCOUNTER — Telehealth: Payer: Self-pay | Admitting: Nurse Practitioner

## 2023-05-17 DIAGNOSIS — B9689 Other specified bacterial agents as the cause of diseases classified elsewhere: Secondary | ICD-10-CM

## 2023-05-17 MED ORDER — METRONIDAZOLE 500 MG PO TABS
500.0000 mg | ORAL_TABLET | Freq: Two times a day (BID) | ORAL | 0 refills | Status: DC
Start: 2023-05-17 — End: 2023-06-28

## 2023-05-17 NOTE — Telephone Encounter (Signed)
Advise pt that tests showed bacterial vaginosis. She has been treated for this in the past (2 years ago). This is NOT a sexually transmitted disease, but an overgrowth of normal bacteria.  The antibiotic is metronidazole--it can cause a bad taste in the mouth, and can interact with any alcohol intake.  It is taken twice daily for a week. She should avoid all alcohol while taking it, and for 24 hours after.

## 2023-05-17 NOTE — Telephone Encounter (Signed)
Pt was notified of results

## 2023-05-17 NOTE — Telephone Encounter (Signed)
Pt was in last week and had labs done, she noticed on [facility] that she may have an infection and was asking if a antibiotic needs to be sent in. I advised that Huntley Dec is out of the office this week and I would send it back to be looked at by another provider. If she does need meds, she uses  [id number] - [place], [place] - [address] AT [facility] OF [address] & [address]

## 2023-05-26 ENCOUNTER — Other Ambulatory Visit: Payer: Self-pay | Admitting: Nurse Practitioner

## 2023-06-28 ENCOUNTER — Ambulatory Visit: Payer: Commercial Managed Care - PPO | Admitting: Nurse Practitioner

## 2023-06-28 ENCOUNTER — Encounter: Payer: Self-pay | Admitting: Nurse Practitioner

## 2023-06-28 VITALS — BP 130/82 | HR 72 | Ht 66.0 in | Wt 274.4 lb

## 2023-06-28 DIAGNOSIS — Z6841 Body Mass Index (BMI) 40.0 and over, adult: Secondary | ICD-10-CM

## 2023-06-28 DIAGNOSIS — K219 Gastro-esophageal reflux disease without esophagitis: Secondary | ICD-10-CM

## 2023-06-28 DIAGNOSIS — G5701 Lesion of sciatic nerve, right lower limb: Secondary | ICD-10-CM | POA: Diagnosis not present

## 2023-06-28 DIAGNOSIS — Z0000 Encounter for general adult medical examination without abnormal findings: Secondary | ICD-10-CM | POA: Diagnosis not present

## 2023-06-28 DIAGNOSIS — G8929 Other chronic pain: Secondary | ICD-10-CM

## 2023-06-28 DIAGNOSIS — M25511 Pain in right shoulder: Secondary | ICD-10-CM | POA: Diagnosis not present

## 2023-06-28 DIAGNOSIS — R030 Elevated blood-pressure reading, without diagnosis of hypertension: Secondary | ICD-10-CM

## 2023-06-28 DIAGNOSIS — E785 Hyperlipidemia, unspecified: Secondary | ICD-10-CM

## 2023-06-28 DIAGNOSIS — I10 Essential (primary) hypertension: Secondary | ICD-10-CM

## 2023-06-28 MED ORDER — PANTOPRAZOLE SODIUM 40 MG PO TBEC
40.0000 mg | DELAYED_RELEASE_TABLET | Freq: Every day | ORAL | 3 refills | Status: DC
Start: 2023-06-28 — End: 2023-10-08

## 2023-06-28 MED ORDER — WEGOVY 2.4 MG/0.75ML ~~LOC~~ SOAJ: 2.4000 mg | SUBCUTANEOUS | 3 refills | Status: DC

## 2023-06-28 MED ORDER — PREDNISONE 20 MG PO TABS
40.0000 mg | ORAL_TABLET | Freq: Every day | ORAL | 0 refills | Status: DC
Start: 2023-06-28 — End: 2024-04-21

## 2023-06-28 NOTE — Assessment & Plan Note (Signed)
 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 Previous treatments including ice, stretching, and tramadol  have been ineffective. X-ray was unremarkable. 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 Discussed oral prednisone  for inflammation reduction, with potential side effects such as increased appetite, mood changes, and elevated blood sugar. Patient willing to try steroids despite preference to avoid medications. - Prescribe oral prednisone  - Recommend continued stretching exercises - Advise use of ice for pain management - Suggest massage therapy - Recommend using a rolling pin for self-massage

## 2023-06-28 NOTE — Assessment & Plan Note (Signed)
 Blood pressure at 130/82 mmHg. Recent lab work shows normal glucose and cholesterol levels. Discussed importance of monitoring blood pressure and lifestyle modifications to manage hypertension. Explained risks of uncontrolled hypertension, including heart disease and stroke. - Monitor blood pressure regularly - Encourage lifestyle modifications including diet and exercise

## 2023-06-28 NOTE — Assessment & Plan Note (Signed)
 Chronic heartburn with worsening symptoms, including nocturnal regurgitation. Over-the-counter medications have been ineffective. Discussed pantoprazole , a proton pump inhibitor, to reduce stomach acid production. Patient agreed to start pantoprazole . - Prescribe pantoprazole  40 mg once daily - Advise avoiding trigger foods such as spicy foods and red sauces

## 2023-06-28 NOTE — Patient Instructions (Signed)
Piriformis Syndrome  Piriformis syndrome is a condition that can cause pain and numbness in your buttocks and down the back of your leg. Piriformis syndrome happens when the small muscle that connects the base of your spine to your hip (piriformis muscle) presses on the nerve that runs down the back of your leg (sciatic nerve). The piriformis muscle helps your hip rotate and helps to bring your leg back and out. It also helps shift your weight to keep you stable while you are walking. The sciatic nerve runs under or through the piriformis muscle. Damage to the piriformis muscle can cause spasms that put pressure on the nerve below. This causes pain and discomfort while sitting and moving. The pain may feel as if it begins in the buttock and spreads (radiates) down your hip and thigh. What are the causes? This condition is caused by pressure on the sciatic nerve from the piriformis muscle. The piriformis muscle can get irritated with overuse, especially if other hip muscles are weak and the piriformis muscle has to do extra work. Piriformis syndrome can also occur after an injury, like a fall onto your buttocks. What increases the risk? You are more likely to develop this condition if you: Are a woman. Sit for long periods of time. Are a cyclist. Have weak buttocks muscles (gluteal muscles). What are the signs or symptoms? Symptoms of this condition include: Pain, tingling, or numbness that starts in the buttock and runs down the back of your leg (sciatica). Pain in the groin or thigh area. Your symptoms may get worse: The longer you sit. When you walk, run, or climb stairs. When straining to have a bowel movement. How is this diagnosed? This condition is diagnosed based on your symptoms, medical history, and physical exam. During the exam, your health care provider may: Move your leg into different positions to check for pain. Press on the muscles of your hip and buttock to see if that  increases your symptoms. You may also have tests, including: Imaging tests such as X-rays, CT, MRI, or ultrasound. Electromyogram (EMG). This test measures electrical signals sent by your nerves into the muscles. Nerve conduction study. This test measures how well electrical signals pass through your nerves. How is this treated? This condition may be treated by: Stopping all activities that cause pain or make your condition worse. Applying ice or using heat therapy. Taking medicines to reduce pain and swelling. Taking a muscle relaxer (muscle relaxant) to stop muscle spasms. Doing range-of-motion and strengthening exercises (physical therapy) as told by your health care provider. Having massage, acupuncture, or local electrical stimulation (transcutaneous electrical nerve stimulation, TENS). Getting an injection of medicine in the piriformis muscle. Your health care provider will choose the medicine based on your condition. He or she may inject: An anti-inflammatory medicine (steroid) to reduce swelling. A numbing medicine (local anesthetic) to block the pain. Botulinum toxin. The toxin blocks nerve impulses to specific muscles to reduce muscle tension. In rare cases, you may need surgery to cut the muscle and release pressure on the nerve if other treatments do not work. Follow these instructions at home: Activity Do not sit for long periods. Get up and walk around every 20 minutes or as often as told by your health care provider. When driving long distances, make sure to take frequent stops to get up and stretch. Use a cushion when you sit on hard surfaces. Do exercises as told by your health care provider. Return to your normal activities as  told by your health care provider. Ask your health care provider what activities are safe for you. Managing pain, stiffness, and swelling     If directed, apply heat to the area as often as told by your health care provider. Use the heat source  that your health care provider recommends, such as a moist heat pack or a heating pad. Place a towel between your skin and the heat source. Leave the heat on for 20-30 minutes. Remove the heat if your skin turns bright red. This is especially important if you are unable to feel pain, heat, or cold. You have a greater risk of getting burned. If directed, put ice on the injured area. To do this: Put ice in a plastic bag. Place a towel between your skin and the bag. Leave the ice on for 20 minutes, 2-3 times a day. Remove the ice if your skin turns bright red. This is very important. If you cannot feel pain, heat, or cold, you have a greater risk of damage to the area. General instructions Take over-the-counter and prescription medicines only as told by your health care provider. Ask your health care provider if the medicine prescribed to you requires you to avoid driving or using machinery. You may need to take these actions to prevent or treat constipation: Drink enough fluid to keep your urine pale yellow. Take over-the-counter or prescription medicines. Eat foods that are high in fiber, such as beans, whole grains, and fresh fruits and vegetables. Limit foods that are high in fat and processed sugars, such as fried or sweet foods. Keep all follow-up visits. This is important. How is this prevented? Do not sit for longer than 20 minutes at a time. When you sit, choose padded surfaces. Warm up and stretch before being active. Cool down and stretch after being active. Contact a health care provider if: Your pain and stiffness continue or get worse. Your leg or hip becomes weak. You have changes in your bowel function or bladder function. Summary Piriformis syndrome is a condition that can cause pain, tingling, and numbness in your buttocks and down the back of your leg. You may try applying heat or ice to relieve the pain. Do not sit for long periods. Get up and walk around every 20  minutes or as often as told by your health care provider. This information is not intended to replace advice given to you by your health care provider. Make sure you discuss any questions you have with your health care provider. Document Revised: 11/25/2020 Document Reviewed: 11/25/2020 [employer] Patient Education  2024 [employer].

## 2023-06-28 NOTE — Assessment & Plan Note (Signed)
 CPE completed today. Review of HM activities and recommendations discussed and provided on AVS. Anticipatory guidance, diet, and exercise recommendations provided. Medications, allergies, and hx reviewed and updated as necessary. Orders placed as listed below.  Plan: - Labs reviewed through [facility]. No changes needed. Discussed findings with patient.  - F/U with CPE in 1 year or sooner for acute/chronic health needs as directed.

## 2023-06-28 NOTE — Assessment & Plan Note (Signed)
 Chronic right shoulder pain with limited range of motion. Previous cortisone injections provided temporary relief. 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 Discussed referral to orthopedics for further evaluation and potential cortisone injection. Patient prefers ultrasound-guided injections in the lateral portion of the shoulder due to previous painful experiences. - Refer to orthopedics for evaluation and potential cortisone injection - Recommend obtaining MRI images to take with her for orthopedic consultation

## 2023-06-28 NOTE — Progress Notes (Signed)
 [name], DNP, AGNP-c [facility] [address] [place], [place] [address] Main Office [phone]  BP 130/82   Pulse 72   Ht 5' 6 (1.676 m)   Wt 274 lb 6.4 oz (124.5 kg)   LMP 06/03/2023   BMI 44.29 kg/m    Subjective:    Patient ID: Katie Morgan, female    DOB: 03-24-1986, 38 y.o.   MRN: [id number]  HPI: Katie Morgan is a 38 y.o. female presenting on 06/28/2023 for comprehensive medical examination.   History of Present Illness Katie Morgan reports persistent right sided sciatic pain that has been unresponsive to over-the-counter medications. The pain originates in the right buttock and radiates down to the right thigh, causing significant discomfort and difficulty in walking. She reports that the pain is severe enough to prevent her from placing her foot flat on the ground. She has been managing the pain with ice and stretching exercises, which have provided some relief.  In addition to the sciatic pain, she also reports experiencing heartburn that has been worsening over time. Over-the-counter medications (famotidine, prilosec, TUMS, etc)  have not been effective in managing this symptom. She describes the symptoms as more severe at night.  Katie Morgan also has a history of shoulder issues. 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 She received cortisone shots in the past, which have provided relief in the left shoulder, but the right shoulder remains limited in mobility. The patient reports that she is unable to lift her right arm fully and experiences pain with sudden movements. Her last cortisone injection in the right shoulder was given through the posterior joint space, which was an unpleasant experience, and she does not wish to have this method again. She reports one injection that was placed laterally with excellent results and no pain.   She has been managing her  hyperlipidemia and hypertension with lifestyle modifications and medication. Recent lab work shows normal glucose levels and good cholesterol levels. However, the patient's blood pressure was noted to be high during her last visit. Her BP is good here today.  She has been prescribed Wegovy  for weight management. Katie Morgan reports feeling great on the Wegovy  and has not experienced any side effects. She plans to increase her exercise after relief of her sciatic pain.  Pertinent items are noted in HPI.   Most Recent Depression Screen:     06/28/2023    3:03 PM 11/06/2021    8:22 AM 09/03/2020    8:28 AM 08/09/2018    9:33 AM  Depression screen PHQ 2/9  Decreased Interest 0 0 0 0  Down, Depressed, Hopeless 0 0 0 0  PHQ - 2 Score 0 0 0 0   Most Recent Anxiety Screen:      No data to display         Most Recent Fall Screen:    06/28/2023    3:03 PM 11/06/2021    8:21 AM 09/03/2020    8:28 AM 08/09/2018    9:33 AM  Fall Risk   Falls in the past year? 0 0 0 0  Number falls in past yr: 0 0 0 0  Injury with Fall? 0 0 0 0  Risk for fall due to : No Fall Risks No Fall Risks    Follow up Falls evaluation completed Falls evaluation completed      Past medical history, surgical history, medications, allergies, family  history and social history reviewed with patient today and changes made to appropriate areas of the chart.  Past Medical History:  Past Medical History:  Diagnosis Date   Anemia    Morbid obesity (HCC)    Routine screening for STI (sexually transmitted infection) 05/11/2023   Severe obesity (BMI >= 40) (HCC) 04/06/2016   Shoulder impingement, left 06/11/2022   Vaginal laceration, initial encounter 02/11/2023   Medications:  Current Outpatient Medications on File Prior to Visit  Medication Sig   Vitamin D , Ergocalciferol , (DRISDOL ) 1.25 MG (50000 UNIT) CAPS capsule TAKE 1 CAPSULE BY MOUTH EVERY 7 DAYS   No current facility-administered medications on file prior to visit.    Surgical History:  History reviewed. No pertinent surgical history. Allergies:  No Known Allergies Family History:  Family History  Problem Relation Age of Onset   Breast cancer Mother    Obesity Mother    Heart failure Mother    Hyperlipidemia Mother    Obesity Father    Heart failure Father    Breast cancer Maternal Aunt 30   Breast cancer Maternal Grandmother    Cancer Maternal Grandmother        Leu Gehrigs   Stroke Neg Hx    Hypertension Neg Hx    Diabetes Mellitus II Neg Hx    Colon cancer Neg Hx        Objective:    BP 130/82   Pulse 72   Ht 5' 6 (1.676 m)   Wt 274 lb 6.4 oz (124.5 kg)   LMP 06/03/2023   BMI 44.29 kg/m   Wt Readings from Last 3 Encounters:  06/28/23 274 lb 6.4 oz (124.5 kg)  05/11/23 276 lb 12.8 oz (125.6 kg)  04/22/23 282 lb 9.6 oz (128.2 kg)    Physical Exam Vitals and nursing note reviewed.  Constitutional:      General: She is not in acute distress.    Appearance: Normal appearance.  HENT:     Head: Normocephalic and atraumatic.     Right Ear: Hearing, tympanic membrane, ear canal and external ear normal.     Left Ear: Hearing, tympanic membrane, ear canal and external ear normal.     Nose: Nose normal.     Right Sinus: No maxillary sinus tenderness or frontal sinus tenderness.     Left Sinus: No maxillary sinus tenderness or frontal sinus tenderness.     Mouth/Throat:     Lips: Pink.     Mouth: Mucous membranes are moist.     Pharynx: Oropharynx is clear.  Eyes:     General: Lids are normal. Vision grossly intact.     Extraocular Movements: Extraocular movements intact.     Conjunctiva/sclera: Conjunctivae normal.     Pupils: Pupils are equal, round, and reactive to light.     Funduscopic exam:    Right eye: Red reflex present.        Left eye: Red reflex present.    Visual Fields: Right eye visual fields normal and left eye visual fields normal.  Neck:     Thyroid: No thyromegaly.     Vascular: No carotid bruit.   Cardiovascular:     Rate and Rhythm: Normal rate and regular rhythm.     Chest Wall: PMI is not displaced.     Pulses: Normal pulses.          Dorsalis pedis pulses are 2+ on the right side and 2+ on the left side.  Posterior tibial pulses are 2+ on the right side and 2+ on the left side.     Heart sounds: Normal heart sounds. No murmur heard. Pulmonary:     Effort: Pulmonary effort is normal. No respiratory distress.     Breath sounds: Normal breath sounds.  Abdominal:     General: Abdomen is flat. Bowel sounds are normal. There is no distension.     Palpations: Abdomen is soft. There is no hepatomegaly, splenomegaly or mass.     Tenderness: There is no abdominal tenderness. There is no right CVA tenderness, left CVA tenderness, guarding or rebound.  Musculoskeletal:        General: Tenderness present.       Arms:     Cervical back: Full passive range of motion without pain, normal range of motion and neck supple. No tenderness.     Right lower leg: No edema.     Left lower leg: No edema.       Legs:     Comments: Area of radiating pain indicated with blue line. Tenderness present in the buttocks with movement, rest, and pressure. ROM and strength intact  Feet:     Left foot:     Toenail Condition: Left toenails are normal.  Lymphadenopathy:     Cervical: No cervical adenopathy.     Upper Body:     Right upper body: No supraclavicular adenopathy.     Left upper body: No supraclavicular adenopathy.  Skin:    General: Skin is warm and dry.     Capillary Refill: Capillary refill takes less than 2 seconds.     Nails: There is no clubbing.  Neurological:     General: No focal deficit present.     Mental Status: She is alert and oriented to person, place, and time.     GCS: GCS eye subscore is 4. GCS verbal subscore is 5. GCS motor subscore is 6.     Sensory: Sensation is intact.     Motor: Motor function is intact.     Coordination: Coordination is intact.     Gait: Gait  is intact.     Deep Tendon Reflexes: Reflexes are normal and symmetric.  Psychiatric:        Attention and Perception: Attention normal.        Mood and Affect: Mood normal.        Speech: Speech normal.        Behavior: Behavior normal. Behavior is cooperative.        Thought Content: Thought content normal.        Cognition and Memory: Cognition and memory normal.        Judgment: Judgment normal.      Results for orders placed or performed in visit on 05/11/23  NuSwab Vaginitis Plus (VG+)   Collection Time: 05/11/23  3:30 PM  Result Value Ref Range   Atopobium vaginae High - 2 (A) Score   BVAB 2 Low - 0 Score   Megasphaera 1 High - 2 (A) Score   Candida albicans, NAA Negative Negative   Candida glabrata, NAA Negative Negative   Trich vag by NAA Negative Negative   Chlamydia trachomatis, NAA Negative Negative   Neisseria gonorrhoeae, NAA Negative Negative  RPR   Collection Time: 05/11/23  4:11 PM  Result Value Ref Range   RPR Ser Ql Non Reactive Non Reactive  HIV Antibody (routine testing w rflx)   Collection Time: 05/11/23  4:11 PM  Result Value Ref  Range   HIV Screen 4th Generation wRfx Non Reactive Non Reactive  Hepatitis C antibody   Collection Time: 05/11/23  4:11 PM  Result Value Ref Range   Hep C Virus Ab Non Reactive Non Reactive       Assessment & Plan:   Problem List Items Addressed This Visit     Chronic right shoulder pain   Chronic right shoulder pain with limited range of motion. Previous cortisone injections provided temporary relief. 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 Discussed referral to orthopedics for further evaluation and potential cortisone injection. Patient prefers ultrasound-guided injections in the lateral portion of the shoulder due to previous painful experiences. - Refer to orthopedics for evaluation and potential cortisone injection -  Recommend obtaining MRI images to take with her for orthopedic consultation      Relevant Medications   predniSONE  (DELTASONE ) 20 MG tablet   Other Relevant Orders   AMB referral to orthopedics   BMI 45.0-49.9, adult (HCC)   Weight management wit Wegovy , diet, and exercise. 12lb weight loss. She has not been able to exercise as she would like due to piriformis and shoulder pain, but plans to start working on this more once these conditions are under control.  - Continue Wegovy .  - Work on diet and exercise management       Relevant Medications   Semaglutide -Weight Management (WEGOVY ) 2.4 MG/0.75ML SOAJ   Hypertension   Blood pressure at 130/82 mmHg. Recent lab work shows normal glucose and cholesterol levels. Discussed importance of monitoring blood pressure and lifestyle modifications to manage hypertension. Explained risks of uncontrolled hypertension, including heart disease and stroke. - Monitor blood pressure regularly - Encourage lifestyle modifications including diet and exercise      Relevant Medications   Semaglutide -Weight Management (WEGOVY ) 2.4 MG/0.75ML SOAJ   Encounter for annual physical exam - Primary   CPE completed today. Review of HM activities and recommendations discussed and provided on AVS. Anticipatory guidance, diet, and exercise recommendations provided. Medications, allergies, and hx reviewed and updated as necessary. Orders placed as listed below.  Plan: - Labs reviewed through [facility]. No changes needed. Discussed findings with patient.  - F/U with CPE in 1 year or sooner for acute/chronic health needs as directed.        Piriformis syndrome, right   Chronic sciatic nerve pain affecting the right buttock and leg, impeding walking and daily activities. Previous treatments including ice, stretching, and tramadol  have been ineffective. X-ray was unremarkable. 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 Discussed oral prednisone  for inflammation reduction, with potential  side effects such as increased appetite, mood changes, and elevated blood sugar. Patient willing to try steroids despite preference to avoid medications. - Prescribe oral prednisone  - Recommend continued stretching exercises - Advise use of ice for pain management - Suggest massage therapy - Recommend using a rolling pin for self-massage      Relevant Medications   predniSONE  (DELTASONE ) 20 MG tablet   Semaglutide -Weight Management (WEGOVY ) 2.4 MG/0.75ML SOAJ   Other Relevant Orders   AMB referral to orthopedics   Gastroesophageal reflux disease   Chronic heartburn with worsening symptoms, including nocturnal regurgitation. Over-the-counter medications have been ineffective. Discussed pantoprazole , a proton pump inhibitor, to reduce stomach acid production. Patient agreed to start pantoprazole . - Prescribe pantoprazole  40 mg once daily - Advise avoiding trigger foods such as spicy foods and red sauces      Relevant Medications  pantoprazole  (PROTONIX ) 40 MG tablet   Elevated lipids      Follow up plan: Return for CPE.  NEXT PREVENTATIVE PHYSICAL DUE IN 1 YEAR.  PATIENT COUNSELING PROVIDED FOR ALL ADULT PATIENTS: A well balanced diet low in saturated fats, cholesterol, and moderation in carbohydrates.  This can be as simple as monitoring portion sizes and cutting back on sugary beverages such as soda and juice to start with.    Daily water consumption of at least 64 ounces.  Physical activity at least 180 minutes per week.  If just starting out, start 10 minutes a day and work your way up.   This can be as simple as taking the stairs instead of the elevator and walking 2-3 laps around the office  purposefully every day.   STD protection, partner selection, and regular testing if high risk.  Limited consumption of alcoholic beverages if alcohol is consumed. For men, I recommend no more than 14 alcoholic beverages per week, spread out throughout the week (max 2 per  day). Avoid binge drinking or consuming large quantities of alcohol in one setting.  Please let me know if you feel you may need help with reduction or quitting alcohol consumption.   Avoidance of nicotine, if used. Please let me know if you feel you may need help with reduction or quitting nicotine use.   Daily mental health attention. This can be in the form of 5 minute daily meditation, prayer, journaling, yoga, reflection, etc.  Purposeful attention to your emotions and mental state can significantly improve your overall wellbeing  and  Health.  Please know that I am here to help you with all of your health care goals and am happy to work with you to find a solution that works best for you.  The greatest advice I have received with any changes in life are to take it one step at a time, that even means if all you can focus on is the next 60 seconds, then do that and celebrate your victories.  With any changes in life, you will have set backs, and that is OK. The important thing to remember is, if you have a set back, it is not a failure, it is an opportunity to try again! Screening Testing Mammogram Every 1 -2 years based on history and risk factors Starting at age 86 Pap Smear Ages 21-39 every 3 years Ages 44-65 every 5 years with HPV testing More frequent testing may be required based on results and history Colon Cancer Screening Every 1-10 years based on test performed, risk factors, and history Starting at age 57 Bone Density Screening Every 2-10 years based on history Starting at age 5 for women Recommendations for men differ based on medication usage, history, and risk factors AAA Screening One time ultrasound Men 79-2 years old who have every smoked Lung Cancer Screening Low Dose Lung CT every 12 months Age 47-80 years with a 30 pack-year smoking history who still smoke or who have quit within the last 15 years   Screening Labs Routine  Labs: Complete Blood Count  (CBC), Complete Metabolic Panel (CMP), Cholesterol (Lipid Panel) Every 6-12 months based on history and medications May be recommended more frequently based on current conditions or previous results Hemoglobin A1c Lab Every 3-12 months based on history and previous results Starting at age 18 or earlier with diagnosis of diabetes, high cholesterol, BMI >26, and/or risk factors Frequent monitoring for patients with diabetes to ensure blood sugar control Thyroid Panel (  TSH) Every 6 months based on history, symptoms, and risk factors May be repeated more often if on medication HIV One time testing for all patients 71 and older May be repeated more frequently for patients with increased risk factors or exposure Hepatitis C One time testing for all patients 42 and older May be repeated more frequently for patients with increased risk factors or exposure Gonorrhea, Chlamydia Every 12 months for all sexually active persons 13-24 years Additional monitoring may be recommended for those who are considered high risk or who have symptoms Every 12 months for any woman on birth control, regardless of sexual activity PSA Men 17-50 years old with risk factors Additional screening may be recommended from age 73-69 based on risk factors, symptoms, and history  Vaccine Recommendations Tetanus Booster All adults every 10 years Flu Vaccine All patients 6 months and older every year COVID Vaccine All patients 12 years and older Initial dosing with booster May recommend additional booster based on age and health history HPV Vaccine 2 doses all patients age 22-26 Dosing may be considered for patients over 26 Shingles Vaccine (Shingrix) 2 doses all adults 55 years and older Pneumonia (Pneumovax 48) All adults 65 years and older May recommend earlier dosing based on health history One year apart from Prevnar 50 Pneumonia (Prevnar 20) All adults 65 years and older Dosed 1 year after Pneumovax  23 Pneumonia (Prevnar 20) One time alternative to the two dosing of 13 and 23 For all adults with initial dose of 23, 20 is recommended 1 year later For all adults with initial dose of 13, 23 is still recommended as second option 1 year later

## 2023-06-28 NOTE — Assessment & Plan Note (Signed)
 Weight management wit Wegovy , diet, and exercise. 12lb weight loss. She has not been able to exercise as she would like due to piriformis and shoulder pain, but plans to start working on this more once these conditions are under control.  - Continue Wegovy .  - Work on diet and exercise management

## 2023-07-06 ENCOUNTER — Other Ambulatory Visit: Payer: Self-pay | Admitting: Nurse Practitioner

## 2023-07-06 DIAGNOSIS — G5701 Lesion of sciatic nerve, right lower limb: Secondary | ICD-10-CM

## 2023-07-20 ENCOUNTER — Ambulatory Visit (INDEPENDENT_AMBULATORY_CARE_PROVIDER_SITE_OTHER): Payer: Commercial Managed Care - PPO | Admitting: Sports Medicine

## 2023-07-20 ENCOUNTER — Encounter: Payer: Self-pay | Admitting: Sports Medicine

## 2023-07-20 ENCOUNTER — Other Ambulatory Visit: Payer: Self-pay

## 2023-07-20 ENCOUNTER — Other Ambulatory Visit (INDEPENDENT_AMBULATORY_CARE_PROVIDER_SITE_OTHER): Payer: Commercial Managed Care - PPO

## 2023-07-20 DIAGNOSIS — G8929 Other chronic pain: Secondary | ICD-10-CM | POA: Diagnosis not present

## 2023-07-20 DIAGNOSIS — M7531 Calcific tendinitis of right shoulder: Secondary | ICD-10-CM | POA: Diagnosis not present

## 2023-07-20 DIAGNOSIS — M25511 Pain in right shoulder: Secondary | ICD-10-CM

## 2023-07-20 MED ORDER — LIDOCAINE HCL 1 % IJ SOLN
2.0000 mL | INTRAMUSCULAR | Status: AC | PRN
Start: 2023-07-20 — End: 2023-07-20
  Administered 2023-07-20: 2 mL

## 2023-07-20 MED ORDER — METHYLPREDNISOLONE ACETATE 40 MG/ML IJ SUSP
40.0000 mg | INTRAMUSCULAR | Status: AC | PRN
Start: 2023-07-20 — End: 2023-07-20
  Administered 2023-07-20: 40 mg via INTRA_ARTICULAR

## 2023-07-20 MED ORDER — BUPIVACAINE HCL 0.25 % IJ SOLN
2.0000 mL | INTRAMUSCULAR | Status: AC | PRN
Start: 2023-07-20 — End: 2023-07-20
  Administered 2023-07-20: 2 mL via INTRA_ARTICULAR

## 2023-07-20 NOTE — Progress Notes (Signed)
 Katie Morgan - 38 y.o. female MRN [id number]  Date of birth: 02-18-1986  Office Visit Note: Visit Date: 07/20/2023 PCP: Oris Camie BRAVO, NP Referred by: Oris Camie BRAVO, NP  Subjective: Chief Complaint  Patient presents with   Right Shoulder - Pain   HPI: Katie Morgan is a pleasant 38 y.o. female who presents today for acute on chronic right shoulder pain.  She has had chronic right shoulder pain.  Back in 2021 she had left shoulder pain which felt very similar.  She had a reported lateral injection under ultrasound guidance with Dr. Hughie in the glenohumeral joint which was very helpful for the left side.  She saw Dr. Joane for the right shoulder one year ago and had an ultrasound-guided glenohumeral joint injection, but actually states this was given from the back and this is painful.  She is hoping for an injection over the lateral aspect of the shoulder which has given her good relief in the past for the contralateral shoulder.  She did take prednisone  previously by her primary for this and another condition but is currently not taking medication.  She has not done any formalized physical therapy for the shoulder.  Pertinent ROS were reviewed with the patient and found to be negative unless otherwise specified above in HPI.   Assessment & Plan: Visit Diagnoses:  1. Chronic right shoulder pain   2. Calcific tendinitis of right shoulder    Plan: Impression is chronic right shoulder pain with x-ray confirmed calcific density noted in likely the supraspinatus origin just off the greater tuberosity of the humeral head. She does have restriction in range of motion, which I believe is more so secondary to her pain as opposed to blocking from adhesive capsulitis.  Through shared decision making, we did proceed with an ultrasound-guided subacromial joint injection and I did pepper this medication into the calcific portion of the supraspinatus as well.  Advised on 48 hours of modified  rest/activity.  May use ice/heat or over-the-counter anti-inflammatory.  We will also get her started in formalized physical therapy to augment her pain relief and improve range of motion.  I would like to see her back about 1 month after starting physical therapy.  If for some reason she is not largely improved, next step would include MRI of the shoulder.  Follow-up: Return in about 5 weeks (around 08/24/2023) for f/u 34-month after starting PT.   Meds & Orders: No orders of the defined types were placed in this encounter.   Orders Placed This Encounter  Procedures   XR Shoulder Right   US  Guided Needle Placement - No Linked Charges   Ambulatory referral to Physical Therapy     Procedures: Large Joint Inj: R subacromial bursa on 07/20/2023 4:50 PM Indications: pain Details: 22 G 1.5 in needle, ultrasound-guided lateral approach Medications: 2 mL lidocaine  1 %; 2 mL bupivacaine  0.25 %; 40 mg methylPREDNISolone  acetate 40 MG/ML Outcome: tolerated well, no immediate complications  US -Guided Subacromial Injection, Left Shoulder  After discussion on risks/benefits/indications, informed verbal consent was obtained. A timeout was then performed. Patient was seated on table in exam room. The patient's shoulder was prepped with chloraprep and alcohol swabs and utilizing lateral approach with ultrasound guidance, the patient's subacromial space and the calcification within the supraspinatus tendon was injected with 2:2:1 mixture of lidocaine :bupivicaine:depomedrol via an in-plane approach. Patient tolerated the procedure well without immediate complications.     Procedure, treatment alternatives, risks and benefits explained, specific risks discussed.  Consent was given by the patient. Immediately prior to procedure a time out was called to verify the correct patient, procedure, equipment, support staff and site/side marked as required. Patient was prepped and draped in the usual sterile fashion.           Clinical History: No specialty comments available.  She reports that she has never smoked. She has never used smokeless tobacco. No results for input(s): HGBA1C, LABURIC in the last 8760 hours.  Objective:   Vital Signs: LMP 06/03/2023   Physical Exam  Gen: Well-appearing, in no acute distress; non-toxic CV: Well-perfused. Warm.  Resp: Breathing unlabored on room air; no wheezing. Psych: Fluid speech in conversation; appropriate affect; normal thought process  Ortho Exam - Right shoulder: + TTP around Codman's point.  There is no redness swelling or effusion.  There is limited forward flexion and abduction, abduction is limited both passive and actively to about 105 degrees, difficult to ascertain whether this is secondary to pain or true blockage.  She has full range of motion with at least 65 degrees of external rotation bilaterally.  Internal rotation and Hawkins impingement testing does cause her pain.  Imaging:  XR Shoulder Right Result Date: 07/20/2023 4 views of the right shoulder including AP, Grashey, scapular Y and axial view were ordered and reviewed by myself today.  X-rays show a calcific density that is about 0.8 cm in size literally just off the greater tuberosity, likely in the location of the rotator cuff/supraspinatus insertion.  The humeral head is well located with good space at the subacromial outlet.  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  No other acute fracture or other bony abnormality noted.   Narrative & Impression  CLINICAL DATA:  Pain   EXAM: RIGHT SHOULDER - 3 VIEW   COMPARISON:  None Available.   FINDINGS: There is no evidence of fracture or dislocation. There is no evidence of arthropathy or other focal bone abnormality. Soft tissues are unremarkable.   IMPRESSION: Negative.     Electronically Signed   By: Fonda Field M.D.   On: 06/27/2022 00:37    Past Medical/Family/Surgical/Social  History: Medications & Allergies reviewed per EMR, new medications updated. 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/06/2016   Past Medical History:  Diagnosis Date   Anemia    Morbid obesity (HCC)    Routine screening for STI (sexually transmitted infection) 05/11/2023   Severe obesity (BMI >= 40) (HCC) 04/06/2016   Shoulder impingement, left 06/11/2022   Vaginal laceration, initial encounter 02/11/2023   Family History  Problem Relation Age of Onset   Breast cancer Mother    Obesity Mother    Heart failure Mother    Hyperlipidemia Mother    Obesity Father    Heart failure Father    Breast cancer Maternal Aunt 45   Breast cancer Maternal Grandmother    Cancer Maternal Grandmother        Leu Gehrigs   Stroke Neg  Hx    Hypertension Neg Hx    Diabetes Mellitus II Neg Hx    Colon cancer Neg Hx    History reviewed. No pertinent surgical history. Social History   Occupational History   Not on file  Tobacco Use   Smoking status: Never   Smokeless tobacco: Never  Vaping Use   Vaping status: Never Used  Substance and Sexual Activity   Alcohol use: No   Drug use: No   Sexual activity: Not Currently

## 2023-07-20 NOTE — Progress Notes (Signed)
 Patient says that she has had right shoulder pain for the last three years. She has pain and is limited in shoulder abduction. She says that she had an injection into the back of the shoulder that was painful and did not give her any pain relief. She mentions that her MRI of her left shoulder showed that her shoulder is sloped, and she had a couple of injections into the left shoulder that gave her good relief. She says that she does have pops occasionally in the left shoulder but these are not painful.

## 2023-08-03 NOTE — Therapy (Signed)
 OUTPATIENT PHYSICAL THERAPY SHOULDER EVALUATION   Patient Name: Katie Morgan MRN: [id number] DOB:1986-05-25, 38 y.o., female Today's Date: 08/06/2023  END OF SESSION:  PT End of Session - 08/06/23 1633     Visit Number 1    Number of Visits 12    Date for PT Re-Evaluation 10/01/23    Progress Note Due on Visit 12    PT Start Time 0800    PT Stop Time 0844    PT Time Calculation (min) 44 min    Activity Tolerance Patient tolerated treatment well;No increased pain;Patient limited by pain    Behavior During Therapy [facility] for tasks assessed/performed             Past Medical History:  Diagnosis Date   Anemia    Morbid obesity (HCC)    Routine screening for STI (sexually transmitted infection) 05/11/2023   Severe obesity (BMI >= 40) (HCC) 04/06/2016   Shoulder impingement, left 06/11/2022   Vaginal laceration, initial encounter 02/11/2023   History reviewed. No pertinent surgical history. Patient Active Problem List   Diagnosis Date Noted   Gastroesophageal reflux disease 06/28/2023   Encounter for annual physical exam 05/11/2023   Hypertension 04/29/2023   Piriformis syndrome, right 04/29/2023   Family history of breast cancer in first degree relative 10/26/2022   Elevated lipids 11/06/2021   Chronic right shoulder pain 11/06/2021   Bilateral pendulous breasts 11/06/2021   Primary insomnia 11/06/2021   Vitamin D deficiency 11/06/2021   BMI 45.0-49.9, adult (HCC) 11/06/2021   Anemia, iron deficiency 05/20/2016   Tinnitus of right ear 04/06/2016    PCP: Tollie Eth, NP  REFERRING PROVIDER: Madelyn Brunner, DO  REFERRING DIAG:  Diagnosis  M25.511,G89.29 (ICD-10-CM) - Chronic right shoulder pain  M75.31 (ICD-10-CM) - Calcific tendinitis of right shoulder    THERAPY DIAG:  Abnormal posture  Muscle weakness (generalized)  Localized edema  Chronic right shoulder pain  Stiffness of right shoulder, not elsewhere classified  Rationale for Evaluation and  Treatment: Rehabilitation  ONSET DATE: Chronic, 2 + years  SUBJECTIVE:                                                                                                                                                                                      SUBJECTIVE STATEMENT: [name] notes 2 previous left shoulder injections.  She is also received 1 on the right and one last summer in the right shoulder.  Hand dominance: Right  PERTINENT HISTORY: Obesity, anemia, HTN  PAIN:  Are you having pain? Yes: NPRS scale: Pain in the morning can be a 6/10, baseline 0/10 Pain location: Upper right lateral arm Pain description: Can  be sharp Aggravating factors: Sleeping, quick movements, impingement position Relieving factors: Get out of that position  PRECAUTIONS: Shoulder, impingement  RED FLAGS: None   WEIGHT BEARING RESTRICTIONS: No  FALLS:  Has patient fallen in last 6 months? No  LIVING ENVIRONMENT: Lives with: lives with their family and mom Lives in: House/apartment Stairs:  No problem Has following equipment at home: None  OCCUPATION: Builds airplanes  PLOF: Independent  PATIENT GOALS: Be able to function overhead without pain, more comfortable mornings.  NEXT MD VISIT: NA  OBJECTIVE:  Note: Objective measures were completed at Evaluation unless otherwise noted.  DIAGNOSTIC FINDINGS:  4 views of the right shoulder including AP, Grashey, scapular Y and axial  view were ordered and reviewed by myself today.  X-rays show a calcific  density that is about 0.8 cm in size literally just off the greater  tuberosity, likely in the location of the rotator cuff/supraspinatus  insertion.  The humeral head is well located with good space at the  subacromial outlet.  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  No other acute fracture or other bony abnormality  noted.  PATIENT SURVEYS:  Patient Specific Functional Scale (PSFS):   COGNITION: Overall  cognitive status: Within functional limits for tasks assessed     SENSATION: WFL  POSTURE: Mildly flexed  UPPER EXTREMITY ROM:   Passive ROM Left/Right 08/06/2023   Shoulder flexion 170/135   Shoulder extension    Shoulder abduction    Shoulder horizontal adduction 35/25   Shoulder internal rotation 70/30   Shoulder external rotation 105/90   Elbow flexion    Elbow extension    Wrist flexion    Wrist extension    Wrist ulnar deviation    Wrist radial deviation    Wrist pronation    Wrist supination    (Blank rows = not tested)  UPPER EXTREMITY STRENGTH:  Assessed with hand-held dynamometer in pounds Left/Right 08/06/2023   Shoulder flexion    Shoulder extension    Shoulder abduction    Shoulder adduction    Shoulder internal rotation 19.9/11.1   Shoulder external rotation 17.4/12.7   Middle trapezius    Lower trapezius    Elbow flexion    Elbow extension    Wrist flexion    Wrist extension    Wrist ulnar deviation    Wrist radial deviation    Wrist pronation    Wrist supination    Grip strength (lbs)    (Blank rows = not tested)                                                                                                                             TREATMENT DATE:  08/06/2023 Scapular protraction/supine arm raise 20 x 3 seconds with 3# Scapular retraction/shoulder blade pinches 10 x 5 seconds Thera-Band ER Red 2 sets of 10 with slow eccentrics Thera-Band IR Red 2 sets of 10 with slow eccentrics  Functional Activities: Reviewed this shoulder anatomy with the shoulder model, discussed her imaging, impingement and that although her prognosis was physical therapy is good, she may be referred back to Dr. Shon Baton and an orthopedic specialist if significant progress is not noted in the first 4 weeks given the large calcific deposit noted on imaging.  PATIENT EDUCATION: Education details: See above Person educated: Patient Education method: Explanation,  Demonstration, Tactile cues, Verbal cues, and Handouts Education comprehension: verbalized understanding, returned demonstration, verbal cues required, tactile cues required, and needs further education  HOME EXERCISE PROGRAM: Access Code: [email or web address] URL: [email or web address] Date: 08/06/2023 Prepared by: Pauletta Browns  Exercises - Supine Scapular Protraction in Flexion with Dumbbells  - 2 x daily - 7 x weekly - 1 sets - 20 reps - 3 seconds hold - Standing Scapular Retraction  - 5 x daily - 7 x weekly - 1 sets - 5 reps - 5 second hold - Shoulder Internal Rotation with Resistance  - 2 x daily - 7 x weekly - 2 sets - 10 reps - 3 seconds hold - Shoulder External Rotation with Anchored Resistance  - 2 x daily - 7 x weekly - 2 sets - 10 reps - 3 seconds hold  ASSESSMENT:  CLINICAL IMPRESSION: Patient is a 38 y.o. female who was seen today for physical therapy evaluation and treatment for  Diagnosis  M25.511,G89.29 (ICD-10-CM) - Chronic right shoulder pain  M75.31 (ICD-10-CM) - Calcific tendinitis of right shoulder  .  Mahiya has a history of bilateral shoulder pain.  She has received 2 cortisone injections into each shoulder in the past, has been told she has a type II acromion and has significant calcific changes on imaging.  Her right shoulder has active range of motion, strength and functional impairments which will be addressed with supervised physical therapy.  Her prognosis to improve the physical therapy is good, although I did let her know that given her imaging, she may be referred back to Dr. Shon Baton or an orthopedic specialist if significant symptomatic progress is not noted in the first 4 weeks of physical therapy  OBJECTIVE IMPAIRMENTS: decreased activity tolerance, decreased endurance, decreased knowledge of condition, decreased ROM, decreased strength, decreased safety awareness, increased edema, impaired perceived functional ability, impaired UE functional use, and  pain.   ACTIVITY LIMITATIONS: carrying, lifting, sleeping, and reach over head  PARTICIPATION LIMITATIONS: community activity and occupation  PERSONAL FACTORS: Obesity, anemia, HTN are also affecting patient's functional outcome.   REHAB POTENTIAL: Good  CLINICAL DECISION MAKING: Stable/uncomplicated  EVALUATION COMPLEXITY: Low   GOALS: Goals reviewed with patient? Yes  SHORT TERM GOALS: Target date: 09/03/2023  Shanikka will be independent with her day 1 HEP Baseline: Started 08/06/2023 Goal status: INITIAL  2.  Improve shoulder AROM (Lt/Rt in degrees) for flexion to 170/170; ER to 105/100; IR to 70/60 and horizontal adduction to 40/40 Baseline: 170/135; 105/90; 70/30 and 35/25 respectively Goal status: INITIAL  3.  Improve shoulder strength (Lt/Rt in pounds) for ER to 20/20 and IR to 25/25 Baseline: 19.9/11.1 and 17.4/12.2 respectively Goal status: INITIAL   LONG TERM GOALS: Target date: 10/01/2023  Trishelle will report right shoulder pain consistently 0-3/10 on the Numeric Pain Rating Scale Baseline: 0-6/10 Goal status: INITIAL  2.  Karri will rate herself 80% or better on the Patient Specific Functional Scale. Baseline: Reach to top shelf, sleep 10/20 Goal status: INITIAL  3.  Improve shoulder strength (Lt/Rt in pounds) for ER to 23/23 and IR to 30/30 Baseline: See STG Goal  status: INITIAL 4.  Jaidon will be independent with her long-term HEP at DC. Baseline: Started 08/06/2023 Goal status: INITIAL  PLAN:  PT FREQUENCY: 1-2x/week  PT DURATION: 8 weeks  PLANNED INTERVENTIONS: 97110-Therapeutic exercises, 97530- Therapeutic activity, 97112- Neuromuscular re-education, 97535- Self Care, 54098- Manual therapy, 97016- Vasopneumatic device, Patient/Family education, Joint mobilization, Cryotherapy, and Moist heat  PLAN FOR NEXT SESSION: Review HEP, address capsular tightness (primarily posterior capsule), scapular and rotator cuff strength while avoiding  impingement.   Cherlyn Cushing, PT, MPT 08/06/2023, 5:22 PM

## 2023-08-06 ENCOUNTER — Encounter: Payer: Self-pay | Admitting: Rehabilitative and Restorative Service Providers"

## 2023-08-06 ENCOUNTER — Ambulatory Visit: Payer: Commercial Managed Care - PPO | Admitting: Rehabilitative and Restorative Service Providers"

## 2023-08-06 DIAGNOSIS — G8929 Other chronic pain: Secondary | ICD-10-CM

## 2023-08-06 DIAGNOSIS — R600 Localized edema: Secondary | ICD-10-CM

## 2023-08-06 DIAGNOSIS — M6281 Muscle weakness (generalized): Secondary | ICD-10-CM

## 2023-08-06 DIAGNOSIS — R293 Abnormal posture: Secondary | ICD-10-CM | POA: Diagnosis not present

## 2023-08-06 DIAGNOSIS — M25511 Pain in right shoulder: Secondary | ICD-10-CM

## 2023-08-06 DIAGNOSIS — M25611 Stiffness of right shoulder, not elsewhere classified: Secondary | ICD-10-CM

## 2023-08-20 ENCOUNTER — Encounter: Payer: Self-pay | Admitting: Rehabilitative and Restorative Service Providers"

## 2023-08-20 ENCOUNTER — Ambulatory Visit: Payer: Commercial Managed Care - PPO | Admitting: Rehabilitative and Restorative Service Providers"

## 2023-08-20 DIAGNOSIS — M25511 Pain in right shoulder: Secondary | ICD-10-CM

## 2023-08-20 DIAGNOSIS — M25611 Stiffness of right shoulder, not elsewhere classified: Secondary | ICD-10-CM

## 2023-08-20 DIAGNOSIS — R600 Localized edema: Secondary | ICD-10-CM

## 2023-08-20 DIAGNOSIS — R293 Abnormal posture: Secondary | ICD-10-CM | POA: Diagnosis not present

## 2023-08-20 DIAGNOSIS — M6281 Muscle weakness (generalized): Secondary | ICD-10-CM

## 2023-08-20 DIAGNOSIS — G8929 Other chronic pain: Secondary | ICD-10-CM

## 2023-08-20 NOTE — Therapy (Signed)
 OUTPATIENT PHYSICAL THERAPY SHOULDER TREATMENT   Patient Name: Katie Morgan MRN: [id number] DOB:1986-01-16, 38 y.o., female Today's Date: 08/20/2023  END OF SESSION:  PT End of Session - 08/20/23 0803     Visit Number 2    Number of Visits 12    Date for PT Re-Evaluation 10/01/23    Progress Note Due on Visit 12    PT Start Time 0802    PT Stop Time 0842    PT Time Calculation (min) 40 min    Activity Tolerance Patient tolerated treatment well;No increased pain;Patient limited by pain    Behavior During Therapy Katie Morgan for tasks assessed/performed              Past Medical History:  Diagnosis Date   Anemia    Morbid obesity (HCC)    Routine screening for STI (sexually transmitted infection) 05/11/2023   Severe obesity (BMI >= 40) (HCC) 04/06/2016   Shoulder impingement, left 06/11/2022   Vaginal laceration, initial encounter 02/11/2023   History reviewed. No pertinent surgical history. Patient Active Problem List   Diagnosis Date Noted   Gastroesophageal reflux disease 06/28/2023   Encounter for annual physical exam 05/11/2023   Hypertension 04/29/2023   Piriformis syndrome, right 04/29/2023   Family history of breast cancer in first degree relative 10/26/2022   Elevated lipids 11/06/2021   Chronic right shoulder pain 11/06/2021   Bilateral pendulous breasts 11/06/2021   Primary insomnia 11/06/2021   Vitamin D deficiency 11/06/2021   BMI 45.0-49.9, adult (HCC) 11/06/2021   Anemia, iron deficiency 05/20/2016   Tinnitus of right ear 04/06/2016    PCP: Tollie Eth, NP  REFERRING PROVIDER: Madelyn Brunner, DO  REFERRING DIAG:  Diagnosis  M25.511,G89.29 (ICD-10-CM) - Chronic right shoulder pain  M75.31 (ICD-10-CM) - Calcific tendinitis of right shoulder    THERAPY DIAG:  Abnormal posture  Muscle weakness (generalized)  Localized edema  Chronic right shoulder pain  Stiffness of right shoulder, not elsewhere classified  Rationale for Evaluation and  Treatment: Rehabilitation  ONSET DATE: Chronic, 2 + years  SUBJECTIVE:                                                                                                                                                                                      SUBJECTIVE STATEMENT: Katie Morgan reports better sleep and good HEP compliance.  HEP compliance closer to 1 x per day (2-3 x recommended).  Katie Morgan notes 2 previous left shoulder injections.  She is also received 1 on the right and one last summer in the right shoulder.  Hand dominance: Right  PERTINENT HISTORY: Obesity, anemia, HTN  PAIN:  Are you having pain?  Yes: NPRS scale: 0-3/10 this week (was 6/10) Pain location: Upper right lateral arm Pain description: Can be sharp Aggravating factors: Sleeping getting better, quick movements, impingement position Relieving factors: Get out of that position  PRECAUTIONS: Shoulder, impingement  RED FLAGS: None   WEIGHT BEARING RESTRICTIONS: No  FALLS:  Has patient fallen in last 6 months? No  LIVING ENVIRONMENT: Lives with: lives with their family and mom Lives in: House/apartment Stairs:  No problem Has following equipment at home: None  OCCUPATION: Builds airplanes  PLOF: Independent  PATIENT GOALS: Be able to function overhead without pain, more comfortable mornings.  NEXT MD VISIT: NA  OBJECTIVE:  Note: Objective measures were completed at Evaluation unless otherwise noted.  DIAGNOSTIC FINDINGS:  4 views of the right shoulder including AP, Grashey, scapular Y and axial  view were ordered and reviewed by myself today.  X-rays show a calcific  density that is about 0.8 cm in size literally just off the greater  tuberosity, likely in the location of the rotator cuff/supraspinatus  insertion.  The humeral head is well located with good space at the  subacromial outlet.  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  No other acute fracture or other bony  abnormality  noted.  PATIENT SURVEYS:  Patient Specific Functional Scale (PSFS):   COGNITION: Overall cognitive status: Within functional limits for tasks assessed     SENSATION: WFL  POSTURE: Mildly flexed  UPPER EXTREMITY ROM:   Passive ROM Left/Right 08/06/2023   Shoulder flexion 170/135   Shoulder extension    Shoulder abduction    Shoulder horizontal adduction 35/25   Shoulder internal rotation 70/30   Shoulder external rotation 105/90   Elbow flexion    Elbow extension    Wrist flexion    Wrist extension    Wrist ulnar deviation    Wrist radial deviation    Wrist pronation    Wrist supination    (Blank rows = not tested)  UPPER EXTREMITY STRENGTH:  Assessed with hand-held dynamometer in pounds Left/Right 08/06/2023   Shoulder flexion    Shoulder extension    Shoulder abduction    Shoulder adduction    Shoulder internal rotation 19.9/11.1   Shoulder external rotation 17.4/12.7   Middle trapezius    Lower trapezius    Elbow flexion    Elbow extension    Wrist flexion    Wrist extension    Wrist ulnar deviation    Wrist radial deviation    Wrist pronation    Wrist supination    Grip strength (lbs)    (Blank rows = not tested)                                                                                                                             TREATMENT DATE:  08/20/2023 Scapular retraction/shoulder blade pinches 10 x 5 seconds Thera-Band ER Red 2 sets of 10 with slow eccentrics Thera-Band IR  Red 2 sets of 10 with slow eccentrics Thera-Band Rows Blue 20 x 3 seconds Supine IR stretch (70 degrees abduction, elbows on pillows) 10 x 10 seconds  Functional Activities: Scapular protraction/supine arm raise 20 x 3 seconds with 5# (reaching) Thumb up the back (reaching behind the back) 10 x 10 seconds 10 x 10 seconds Posterior capsule stretch 10 x 10 seconds (reaching across the body)  Large calcific deposit noted on imaging.   08/06/2023 Scapular  protraction/supine arm raise 20 x 3 seconds with 3# Scapular retraction/shoulder blade pinches 10 x 5 seconds Thera-Band ER Red 2 sets of 10 with slow eccentrics Thera-Band IR Red 2 sets of 10 with slow eccentrics  Functional Activities: Reviewed this shoulder anatomy with the shoulder model, discussed her imaging, impingement and that although her prognosis was physical therapy is good, she may be referred back to Dr. Shon Baton and an orthopedic specialist if significant progress is not noted in the first 4 weeks given the large calcific deposit noted on imaging.  PATIENT EDUCATION: Education details: See above Person educated: Patient Education method: Explanation, Demonstration, Tactile cues, Verbal cues, and Handouts Education comprehension: verbalized understanding, returned demonstration, verbal cues required, tactile cues required, and needs further education  HOME EXERCISE PROGRAM: Access Code: [email or web address] URL: [email or web address] Date: 08/20/2023 Prepared by: Pauletta Browns  Exercises - Supine Scapular Protraction in Flexion with Dumbbells  - 1-2 x daily - 7 x weekly - 1 sets - 20 reps - 3 seconds hold - Standing Scapular Retraction  - 5 x daily - 7 x weekly - 1 sets - 5 reps - 5 second hold - Shoulder Internal Rotation with Resistance  - 1-2 x daily - 7 x weekly - 2 sets - 10 reps - 3 seconds hold - Shoulder External Rotation with Anchored Resistance  - 1-2 x daily - 7 x weekly - 2 sets - 10 reps - 3 seconds hold - Supine Shoulder Internal Rotation  - 1-2 x daily - 7 x weekly - 1 sets - 20 reps - 10 seconds hold - Standing Shoulder Posterior Capsule Stretch  - 2-3 x daily - 7 x weekly - 1 sets - 10 reps - 10 hold - Standing Shoulder Internal Rotation Stretch with Hands Behind Back  - 2-3 x daily - 7 x weekly - 1 sets - 10 reps - 10 seconds hold  ASSESSMENT:  CLINICAL IMPRESSION: Katie Morgan reports consistent HEP compliance "about 1 x a day."  Sleep has improved as has  pain during the first 2 weeks of supervised PT.  Impingement symptoms persist with IR and Katie Morgan will benefit from the recommended course of physical therapy.  Patient is a 38 y.o. female who was seen today for physical therapy evaluation and treatment for  Diagnosis  M25.511,G89.29 (ICD-10-CM) - Chronic right shoulder pain  M75.31 (ICD-10-CM) - Calcific tendinitis of right shoulder  .  Katie Morgan has a history of bilateral shoulder pain.  She has received 2 cortisone injections into each shoulder in the past, has been told she has a type II acromion and has significant calcific changes on imaging.  Her right shoulder has active range of motion, strength and functional impairments which will be addressed with supervised physical therapy.  Her prognosis to improve the physical therapy is good, although I did let her know that given her imaging, she may be referred back to Dr. Shon Baton or an orthopedic specialist if significant symptomatic progress is not noted in the first 4 weeks of physical therapy  OBJECTIVE  IMPAIRMENTS: decreased activity tolerance, decreased endurance, decreased knowledge of condition, decreased ROM, decreased strength, decreased safety awareness, increased edema, impaired perceived functional ability, impaired UE functional use, and pain.   ACTIVITY LIMITATIONS: carrying, lifting, sleeping, and reach over head  PARTICIPATION LIMITATIONS: community activity and occupation  PERSONAL FACTORS: Obesity, anemia, HTN are also affecting patient's functional outcome.   REHAB POTENTIAL: Good  CLINICAL DECISION MAKING: Stable/uncomplicated  EVALUATION COMPLEXITY: Low   GOALS: Goals reviewed with patient? Yes  SHORT TERM GOALS: Target date: 09/03/2023  Katie Morgan will be independent with her day 1 HEP Baseline: Started 08/06/2023 Goal status: Met 08/20/2023  2.  Improve shoulder AROM (Lt/Rt in degrees) for flexion to 170/170; ER to 105/100; IR to 70/60 and horizontal adduction to  40/40 Baseline: 170/135; 105/90; 70/30 and 35/25 respectively Goal status: INITIAL  3.  Improve shoulder strength (Lt/Rt in pounds) for ER to 20/20 and IR to 25/25 Baseline: 19.9/11.1 and 17.4/12.2 respectively Goal status: INITIAL   LONG TERM GOALS: Target date: 10/01/2023  Katie Morgan will report right shoulder pain consistently 0-3/10 on the Numeric Pain Rating Scale Baseline: 0-6/10 Goal status: On Going 08/20/2023  2.  Katie Morgan will rate herself 80% or better on the Patient Specific Functional Scale. Baseline: Reach to top shelf, sleep 10/20 Goal status: INITIAL  3.  Improve shoulder strength (Lt/Rt in pounds) for ER to 23/23 and IR to 30/30 Baseline: See STG Goal status: INITIAL 4.  Katie Morgan will be independent with her long-term HEP at DC. Baseline: Started 08/06/2023 Goal status: INITIAL  PLAN:  PT FREQUENCY: 1-2x/week  PT DURATION: 8 weeks  PLANNED INTERVENTIONS: 97110-Therapeutic exercises, 97530- Therapeutic activity, 97112- Neuromuscular re-education, 97535- Self Care, 16109- Manual therapy, 97016- Vasopneumatic device, Patient/Family education, Joint mobilization, Cryotherapy, and Moist heat  PLAN FOR NEXT SESSION: Review HEP, address capsular tightness (primarily posterior capsule), scapular and rotator cuff strength while avoiding impingement.   Katie Morgan, PT, MPT 08/20/2023, 8:49 AM

## 2023-08-25 ENCOUNTER — Encounter: Payer: Commercial Managed Care - PPO | Admitting: Rehabilitative and Restorative Service Providers"

## 2023-09-01 ENCOUNTER — Encounter: Payer: Self-pay | Admitting: Rehabilitative and Restorative Service Providers"

## 2023-09-01 ENCOUNTER — Ambulatory Visit (INDEPENDENT_AMBULATORY_CARE_PROVIDER_SITE_OTHER): Payer: Commercial Managed Care - PPO | Admitting: Rehabilitative and Restorative Service Providers"

## 2023-09-01 DIAGNOSIS — M6281 Muscle weakness (generalized): Secondary | ICD-10-CM | POA: Diagnosis not present

## 2023-09-01 DIAGNOSIS — R293 Abnormal posture: Secondary | ICD-10-CM

## 2023-09-01 DIAGNOSIS — R600 Localized edema: Secondary | ICD-10-CM | POA: Diagnosis not present

## 2023-09-01 DIAGNOSIS — M25611 Stiffness of right shoulder, not elsewhere classified: Secondary | ICD-10-CM

## 2023-09-01 DIAGNOSIS — G8929 Other chronic pain: Secondary | ICD-10-CM

## 2023-09-01 DIAGNOSIS — M25511 Pain in right shoulder: Secondary | ICD-10-CM | POA: Diagnosis not present

## 2023-09-01 NOTE — Therapy (Signed)
 OUTPATIENT PHYSICAL THERAPY SHOULDER TREATMENT   Patient Name: MEGEN MADEWELL MRN: [id number] DOB:1986/02/21, 38 y.o., female Today's Date: 09/01/2023  END OF SESSION:  PT End of Session - 09/01/23 1604     Visit Number 3    Number of Visits 12    Date for PT Re-Evaluation 10/01/23    Progress Note Due on Visit 12    PT Start Time 1604    PT Stop Time 1644    PT Time Calculation (min) 40 min    Activity Tolerance Patient tolerated treatment well;No increased pain;Patient limited by pain    Behavior During Therapy [facility] for tasks assessed/performed               Past Medical History:  Diagnosis Date   Anemia    Morbid obesity (HCC)    Routine screening for STI (sexually transmitted infection) 05/11/2023   Severe obesity (BMI >= 40) (HCC) 04/06/2016   Shoulder impingement, left 06/11/2022   Vaginal laceration, initial encounter 02/11/2023   History reviewed. No pertinent surgical history. Patient Active Problem List   Diagnosis Date Noted   Gastroesophageal reflux disease 06/28/2023   Encounter for annual physical exam 05/11/2023   Hypertension 04/29/2023   Piriformis syndrome, right 04/29/2023   Family history of breast cancer in first degree relative 10/26/2022   Elevated lipids 11/06/2021   Chronic right shoulder pain 11/06/2021   Bilateral pendulous breasts 11/06/2021   Primary insomnia 11/06/2021   Vitamin D deficiency 11/06/2021   BMI 45.0-49.9, adult (HCC) 11/06/2021   Anemia, iron deficiency 05/20/2016   Tinnitus of right ear 04/06/2016    PCP: Tollie Eth, NP  REFERRING PROVIDER: Madelyn Brunner, DO  REFERRING DIAG:  Diagnosis  M25.511,G89.29 (ICD-10-CM) - Chronic right shoulder pain  M75.31 (ICD-10-CM) - Calcific tendinitis of right shoulder    THERAPY DIAG:  Abnormal posture  Muscle weakness (generalized)  Localized edema  Chronic right shoulder pain  Stiffness of right shoulder, not elsewhere classified  Rationale for Evaluation and  Treatment: Rehabilitation  ONSET DATE: Chronic, 2 + years  SUBJECTIVE:                                                                                                                                                                                      SUBJECTIVE STATEMENT: Allyana reports that sleep continues to improve.  She also reports good HEP compliance.  Pain is also significantly improved as compared to evaluation.  Marieanne notes 2 previous left shoulder injections.  She is also received 1 on the right and one last summer in the right shoulder.  Hand dominance: Right  PERTINENT HISTORY: Obesity, anemia, HTN  PAIN:  Are you having pain? Yes: NPRS scale: 0-2/10 this week (was 6/10) Pain location: Upper right lateral arm Pain description: Can be sharp Aggravating factors: Sleeping getting better, quick movements, impingement position Relieving factors: Get out of that position  PRECAUTIONS: Shoulder, impingement  RED FLAGS: None   WEIGHT BEARING RESTRICTIONS: No  FALLS:  Has patient fallen in last 6 months? No  LIVING ENVIRONMENT: Lives with: lives with their family and mom Lives in: House/apartment Stairs:  No problem Has following equipment at home: None  OCCUPATION: Builds airplanes  PLOF: Independent  PATIENT GOALS: Be able to function overhead without pain, more comfortable mornings.  NEXT MD VISIT: NA  OBJECTIVE:  Note: Objective measures were completed at Evaluation unless otherwise noted.  DIAGNOSTIC FINDINGS:  4 views of the right shoulder including AP, Grashey, scapular Y and axial  view were ordered and reviewed by myself today.  X-rays show a calcific  density that is about 0.8 cm in size literally just off the greater  tuberosity, likely in the location of the rotator cuff/supraspinatus  insertion.  The humeral head is well located with good space at the  subacromial outlet.  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   No other acute fracture or other bony abnormality  noted.  PATIENT SURVEYS:  Patient Specific Functional Scale (PSFS): 80% subjectively 09/01/2023  COGNITION: Overall cognitive status: Within functional limits for tasks assessed     SENSATION: WFL  POSTURE: Mildly flexed  UPPER EXTREMITY ROM:   Passive ROM Left/Right 08/06/2023 Right 09/01/2023  Shoulder flexion 170/135 140  Shoulder extension    Shoulder abduction    Shoulder horizontal adduction 35/25 30  Shoulder internal rotation 70/30 65  Shoulder external rotation 105/90 90  Elbow flexion    Elbow extension    Wrist flexion    Wrist extension    Wrist ulnar deviation    Wrist radial deviation    Wrist pronation    Wrist supination    (Blank rows = not tested)  UPPER EXTREMITY STRENGTH:  Assessed with hand-held dynamometer in pounds Left/Right 08/06/2023 Right 09/01/2023  Shoulder flexion    Shoulder extension    Shoulder abduction    Shoulder adduction    Shoulder internal rotation 19.9/11.1 15.7  Shoulder external rotation 17.4/12.7 15.4  Middle trapezius    Lower trapezius    Elbow flexion    Elbow extension    Wrist flexion    Wrist extension    Wrist ulnar deviation    Wrist radial deviation    Wrist pronation    Wrist supination    Grip strength (lbs)    (Blank rows = not tested)                                                                                                                             TREATMENT DATE:  09/01/2023 Scapular retraction/shoulder blade pinches 5 x 5 seconds Thera-Band ER Green 2  sets of 10 with slow eccentrics Thera-Band IR Green 2 sets of 10 with slow eccentrics Thera-Band Rows Blue 20 x 3 seconds Supine IR stretch (70 degrees abduction, elbows on pillows) 10 x 10 seconds  Functional Activities: Scapular protraction/supine arm raise 20 x 3 seconds with 6# (reaching) Thumb up the back (reaching behind the back) 5 x 10 seconds 10 x 10 seconds Posterior capsule stretch  5 x 10 seconds (reaching across the body) Review and modify her current home exercise program based on objective assessment today   08/20/2023 Scapular retraction/shoulder blade pinches 10 x 5 seconds Thera-Band ER Red 2 sets of 10 with slow eccentrics Thera-Band IR Red 2 sets of 10 with slow eccentrics Thera-Band Rows Blue 20 x 3 seconds Supine IR stretch (70 degrees abduction, elbows on pillows) 10 x 10 seconds  Functional Activities: Scapular protraction/supine arm raise 20 x 3 seconds with 5# (reaching) Thumb up the back (reaching behind the back) 10 x 10 seconds 10 x 10 seconds Posterior capsule stretch 10 x 10 seconds (reaching across the body)  Large calcific deposit noted on imaging.   08/06/2023 Scapular protraction/supine arm raise 20 x 3 seconds with 3# Scapular retraction/shoulder blade pinches 10 x 5 seconds Thera-Band ER Red 2 sets of 10 with slow eccentrics Thera-Band IR Red 2 sets of 10 with slow eccentrics  Functional Activities: Reviewed this shoulder anatomy with the shoulder model, discussed her imaging, impingement and that although her prognosis was physical therapy is good, she may be referred back to Dr. Shon Baton and an orthopedic specialist if significant progress is not noted in the first 4 weeks given the large calcific deposit noted on imaging.  PATIENT EDUCATION: Education details: See above Person educated: Patient Education method: Explanation, Demonstration, Tactile cues, Verbal cues, and Handouts Education comprehension: verbalized understanding, returned demonstration, verbal cues required, tactile cues required, and needs further education  HOME EXERCISE PROGRAM: Access Code: [email or web address] URL: [email or web address] Date: 09/01/2023 Prepared by: Pauletta Browns  Exercises - Supine Scapular Protraction in Flexion with Dumbbells  - 1 x daily - 7 x weekly - 1 sets - 30 reps - 3 seconds hold - Standing Scapular Retraction  - 5 x daily - 7 x  weekly - 1 sets - 5 reps - 5 second hold - Shoulder Internal Rotation with Resistance  - 1 x daily - 7 x weekly - 2 sets - 10 reps - 3 seconds hold - Shoulder External Rotation with Anchored Resistance  - 1 x daily - 7 x weekly - 2 sets - 10 reps - 3 seconds hold - Supine Shoulder Internal Rotation  - 1 x daily - 7 x weekly - 1 sets - 10 reps - 10 seconds hold - Standing Shoulder Posterior Capsule Stretch  - 1 x daily - 7 x weekly - 1 sets - 5 reps - 10 hold - Standing Shoulder Internal Rotation Stretch with Hands Behind Back  - 1 x daily - 7 x weekly - 1 sets - 10 reps - 10 seconds hold  ASSESSMENT:  CLINICAL IMPRESSION: Shatira is making significant objective progress towards long-term goals.  Active range of motion, capsular flexibility, scapular and rotator cuff strength have improved since starting supervised physical therapy.  All the above mentioned areas will benefit from continued work and should assist in helping [place] meet her long-term goals.  I anticipate she will be ready for independent rehabilitation at the end of her current authorization.   Patient is a 38 y.o. female who was  seen today for physical therapy evaluation and treatment for  Diagnosis  M25.511,G89.29 (ICD-10-CM) - Chronic right shoulder pain  M75.31 (ICD-10-CM) - Calcific tendinitis of right shoulder  .  Delainy has a history of bilateral shoulder pain.  She has received 2 cortisone injections into each shoulder in the past, has been told she has a type II acromion and has significant calcific changes on imaging.  Her right shoulder has active range of motion, strength and functional impairments which will be addressed with supervised physical therapy.  Her prognosis to improve the physical therapy is good, although I did let her know that given her imaging, she may be referred back to Dr. Shon Baton or an orthopedic specialist if significant symptomatic progress is not noted in the first 4 weeks of physical  therapy  OBJECTIVE IMPAIRMENTS: decreased activity tolerance, decreased endurance, decreased knowledge of condition, decreased ROM, decreased strength, decreased safety awareness, increased edema, impaired perceived functional ability, impaired UE functional use, and pain.   ACTIVITY LIMITATIONS: carrying, lifting, sleeping, and reach over head  PARTICIPATION LIMITATIONS: community activity and occupation  PERSONAL FACTORS: Obesity, anemia, HTN are also affecting patient's functional outcome.   REHAB POTENTIAL: Good  CLINICAL DECISION MAKING: Stable/uncomplicated  EVALUATION COMPLEXITY: Low   GOALS: Goals reviewed with patient? Yes  SHORT TERM GOALS: Target date: 09/03/2023  Nao will be independent with her day 1 HEP Baseline: Started 08/06/2023 Goal status: Met 08/20/2023  2.  Improve shoulder AROM (Lt/Rt in degrees) for flexion to 170/170; ER to 105/100; IR to 70/60 and horizontal adduction to 40/40 Baseline: 170/135; 105/90; 70/30 and 35/25 respectively Goal status: Partially met 09/01/2023  3.  Improve shoulder strength (Lt/Rt in pounds) for ER to 20/20 and IR to 25/25 Baseline: 19.9/11.1 and 17.4/12.2 respectively Goal status: On Going 09/01/2023   LONG TERM GOALS: Target date: 10/01/2023  Amnah will report right shoulder pain consistently 0-3/10 on the Numeric Pain Rating Scale Baseline: 0-6/10 Goal status: Met 09/01/2023  2.  Moria will rate herself 80% or better on the Patient Specific Functional Scale. Baseline: Reach to top shelf, sleep 10/20 Goal status: Met 09/01/2023  3.  Improve shoulder strength (Lt/Rt in pounds) for ER to 23/23 and IR to 30/30 Baseline: See STG Goal status: On Going 09/01/2023 4.  Donyell will be independent with her long-term HEP at DC. Baseline: Started 08/06/2023 Goal status: INITIAL  PLAN:  PT FREQUENCY: 1-2x/week  PT DURATION: 8 weeks  PLANNED INTERVENTIONS: 97110-Therapeutic exercises, 97530- Therapeutic activity, 97112-  Neuromuscular re-education, 97535- Self Care, 09811- Manual therapy, 97016- Vasopneumatic device, Patient/Family education, Joint mobilization, Cryotherapy, and Moist heat  PLAN FOR NEXT SESSION: Address capsular tightness (primarily posterior capsule), scapular and rotator cuff strength while avoiding impingement.  UBE, body blade and possibly prone strength progression.   Cherlyn Cushing, PT, MPT 09/01/2023, 5:31 PM

## 2023-09-09 ENCOUNTER — Encounter: Payer: Commercial Managed Care - PPO | Admitting: Rehabilitative and Restorative Service Providers"

## 2023-09-20 ENCOUNTER — Telehealth: Payer: Self-pay

## 2023-09-20 ENCOUNTER — Other Ambulatory Visit (HOSPITAL_COMMUNITY): Payer: Self-pay

## 2023-09-20 NOTE — Telephone Encounter (Signed)
 Pharmacy Patient Advocate Encounter   Received notification from CoverMyMeds that prior authorization for [facility] 2.4MG /0.75ML auto-injectors is required/requested.   Insurance verification completed.   The patient is insured through [facility] .   Per test claim: PA required; PA submitted to above mentioned insurance via CoverMyMeds Key/confirmation #/EOC (Key: [id number])   Status is pending

## 2023-09-21 ENCOUNTER — Telehealth: Payer: Self-pay

## 2023-09-21 ENCOUNTER — Other Ambulatory Visit (HOSPITAL_COMMUNITY): Payer: Self-pay

## 2023-09-21 NOTE — Telephone Encounter (Signed)
 Pharmacy Patient Advocate Encounter  Received notification from [facility] that Prior Authorization for [facility] 2.4MG /0.75ML auto-injectors has been DENIED.  Full denial letter will be uploaded to the media tab. See denial reason below.  Pt has not lost atleast 5% of her baseline weight since starting therapy.     PA #/Case ID/Reference #: (Key: [id number])

## 2023-09-21 NOTE — Telephone Encounter (Signed)
 Pharmacy Patient Advocate Encounter   Received notification from Patient Advice Request messages that prior authorization for [facility] 2.4MG /0.75ML auto-injectors is required/requested.   Insurance verification completed.   The patient is insured through [facility] .   Per test claim: PA required; PA submitted to above mentioned insurance via CoverMyMeds Key/confirmation #/EOC (Key: [id number])   Status is pending

## 2023-09-23 ENCOUNTER — Ambulatory Visit (INDEPENDENT_AMBULATORY_CARE_PROVIDER_SITE_OTHER): Admitting: Rehabilitative and Restorative Service Providers"

## 2023-09-23 ENCOUNTER — Encounter: Payer: Self-pay | Admitting: Rehabilitative and Restorative Service Providers"

## 2023-09-23 DIAGNOSIS — M25611 Stiffness of right shoulder, not elsewhere classified: Secondary | ICD-10-CM

## 2023-09-23 DIAGNOSIS — M6281 Muscle weakness (generalized): Secondary | ICD-10-CM | POA: Diagnosis not present

## 2023-09-23 DIAGNOSIS — M25511 Pain in right shoulder: Secondary | ICD-10-CM

## 2023-09-23 DIAGNOSIS — R293 Abnormal posture: Secondary | ICD-10-CM | POA: Diagnosis not present

## 2023-09-23 DIAGNOSIS — R600 Localized edema: Secondary | ICD-10-CM | POA: Diagnosis not present

## 2023-09-23 DIAGNOSIS — G8929 Other chronic pain: Secondary | ICD-10-CM

## 2023-09-23 NOTE — Therapy (Signed)
 OUTPATIENT PHYSICAL THERAPY SHOULDER TREATMENT   Patient Name: Katie Morgan MRN: [id number] DOB:30-May-1986, 38 y.o., female Today's Date: 09/23/2023  END OF SESSION:  PT End of Session - 09/23/23 1550     Visit Number 4    Number of Visits 12    Date for PT Re-Evaluation 10/01/23    Progress Note Due on Visit 12    PT Start Time 1549    PT Stop Time 1635    PT Time Calculation (min) 46 min    Activity Tolerance Patient tolerated treatment well;No increased pain;Patient limited by pain    Behavior During Therapy [facility] for tasks assessed/performed             Past Medical History:  Diagnosis Date   Anemia    Morbid obesity (HCC)    Routine screening for STI (sexually transmitted infection) 05/11/2023   Severe obesity (BMI >= 40) (HCC) 04/06/2016   Shoulder impingement, left 06/11/2022   Vaginal laceration, initial encounter 02/11/2023   History reviewed. No pertinent surgical history. Patient Active Problem List   Diagnosis Date Noted   Gastroesophageal reflux disease 06/28/2023   Encounter for annual physical exam 05/11/2023   Hypertension 04/29/2023   Piriformis syndrome, right 04/29/2023   Family history of breast cancer in first degree relative 10/26/2022   Elevated lipids 11/06/2021   Chronic right shoulder pain 11/06/2021   Bilateral pendulous breasts 11/06/2021   Primary insomnia 11/06/2021   Vitamin D deficiency 11/06/2021   BMI 45.0-49.9, adult (HCC) 11/06/2021   Anemia, iron deficiency 05/20/2016   Tinnitus of right ear 04/06/2016    PCP: Tollie Eth, NP  REFERRING PROVIDER: Madelyn Brunner, DO  REFERRING DIAG:  Diagnosis  M25.511,G89.29 (ICD-10-CM) - Chronic right shoulder pain  M75.31 (ICD-10-CM) - Calcific tendinitis of right shoulder    THERAPY DIAG:  Abnormal posture  Muscle weakness (generalized)  Localized edema  Chronic right shoulder pain  Stiffness of right shoulder, not elsewhere classified  Rationale for Evaluation and  Treatment: Rehabilitation  ONSET DATE: Chronic, 2 + years  SUBJECTIVE:                                                                                                                                                                                      SUBJECTIVE STATEMENT: Katie Morgan reports that sleep has been good.  She also reports "fair" HEP compliance.  Pain has been more evident over the past week.  Katie Morgan notes 2 previous left shoulder injections.  She is also received 1 on the right and one last summer in the right shoulder.  Hand dominance: Right  PERTINENT HISTORY: Obesity, anemia, HTN  PAIN:  Are  you having pain? Yes: NPRS scale: 0-7/10 this week (was 6/10) Pain location: Upper right lateral arm Pain description: Can be sharp Aggravating factors: Lying on the right side, quick movements, impingement position Relieving factors: Get out of that position  PRECAUTIONS: Shoulder, impingement  RED FLAGS: None   WEIGHT BEARING RESTRICTIONS: No  FALLS:  Has patient fallen in last 6 months? No  LIVING ENVIRONMENT: Lives with: lives with their family and mom Lives in: House/apartment Stairs:  No problem Has following equipment at home: None  OCCUPATION: Builds airplanes  PLOF: Independent  PATIENT GOALS: Be able to function overhead without pain, more comfortable mornings.  NEXT MD VISIT: NA  OBJECTIVE:  Note: Objective measures were completed at Evaluation unless otherwise noted.  DIAGNOSTIC FINDINGS:  4 views of the right shoulder including AP, Grashey, scapular Y and axial  view were ordered and reviewed by myself today.  X-rays show a calcific  density that is about 0.8 cm in size literally just off the greater  tuberosity, likely in the location of the rotator cuff/supraspinatus  insertion.  The humeral head is well located with good space at the  subacromial outlet.  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  No other acute  fracture or other bony abnormality  noted.  PATIENT SURVEYS:  09/23/2023: Patient Specific Functional Scale (PSFS): 70% subjectively 09/01/2023  Eval: Patient Specific Functional Scale (PSFS): 80% subjectively 09/01/2023  COGNITION: Overall cognitive status: Within functional limits for tasks assessed     SENSATION: WFL  POSTURE: Mildly flexed  UPPER EXTREMITY ROM:   Passive ROM Left/Right 08/06/2023 Right 09/01/2023 Right 09/23/2023  Shoulder flexion 170/135 140 150  Shoulder extension     Shoulder abduction     Shoulder horizontal adduction 35/25 30 40  Shoulder internal rotation 70/30 65 60  Shoulder external rotation 105/90 90 95  Elbow flexion     Elbow extension     Wrist flexion     Wrist extension     Wrist ulnar deviation     Wrist radial deviation     Wrist pronation     Wrist supination     (Blank rows = not tested)  UPPER EXTREMITY STRENGTH:  Assessed with hand-held dynamometer in pounds Left/Right 08/06/2023 Right 09/01/2023 Right 09/23/2023  Shoulder flexion     Shoulder extension     Shoulder abduction     Shoulder adduction     Shoulder internal rotation 19.9/11.1 15.7 24.9  Shoulder external rotation 17.4/12.7 15.4 12.9  Middle trapezius     Lower trapezius     Elbow flexion     Elbow extension     Wrist flexion     Wrist extension     Wrist ulnar deviation     Wrist radial deviation     Wrist pronation     Wrist supination     Grip strength (lbs)     (Blank rows = not tested)  TREATMENT DATE:  09/23/2023 Scapular retraction/shoulder blade pinches 5 x 5 seconds Thera-Band ER Green 2 sets of 10 with slow eccentrics Thera-Band IR Green 2 sets of 10 with slow eccentrics Thera-Band Rows Blue 20 x 3 seconds Supine IR stretch (70 degrees abduction, elbows on pillows) 10 x 10 seconds  Functional Activities: Scapular  protraction/supine arm raise 20 x 3 seconds with 6# (reaching) Thumb up the back (reaching behind the back) 10 x 10 seconds Posterior capsule stretch 10 x 10 seconds (reaching across the body) Reviewed objective measurements and modified her current home exercise program based on objective assessment today.  Discussed questions about possible surgery will be easier to answer in 2 weeks when we get additional objective strength measurements.   09/01/2023 Scapular retraction/shoulder blade pinches 5 x 5 seconds Thera-Band ER Green 2 sets of 10 with slow eccentrics Thera-Band IR Green 2 sets of 10 with slow eccentrics Thera-Band Rows Blue 20 x 3 seconds Supine IR stretch (70 degrees abduction, elbows on pillows) 10 x 10 seconds  Functional Activities: Scapular protraction/supine arm raise 20 x 3 seconds with 6# (reaching) Thumb up the back (reaching behind the back) 5 x 10 seconds 10 x 10 seconds Posterior capsule stretch 5 x 10 seconds (reaching across the body) Review and modify her current home exercise program based on objective assessment today   08/20/2023 Scapular retraction/shoulder blade pinches 10 x 5 seconds Thera-Band ER Red 2 sets of 10 with slow eccentrics Thera-Band IR Red 2 sets of 10 with slow eccentrics Thera-Band Rows Blue 20 x 3 seconds Supine IR stretch (70 degrees abduction, elbows on pillows) 10 x 10 seconds  Functional Activities: Scapular protraction/supine arm raise 20 x 3 seconds with 5# (reaching) Thumb up the back (reaching behind the back) 10 x 10 seconds 10 x 10 seconds Posterior capsule stretch 10 x 10 seconds (reaching across the body)  Large calcific deposit noted on imaging.   08/06/2023 Scapular protraction/supine arm raise 20 x 3 seconds with 3# Scapular retraction/shoulder blade pinches 10 x 5 seconds Thera-Band ER Red 2 sets of 10 with slow eccentrics Thera-Band IR Red 2 sets of 10 with slow eccentrics  PATIENT EDUCATION: Education details:  See above Person educated: Patient Education method: Explanation, Demonstration, Tactile cues, Verbal cues, and Handouts Education comprehension: verbalized understanding, returned demonstration, verbal cues required, tactile cues required, and needs further education  HOME EXERCISE PROGRAM: Access Code: [email or web address] URL: [email or web address] Date: 09/23/2023 Prepared by: Pauletta Browns  Exercises - Supine Scapular Protraction in Flexion with Dumbbells  - 1 x daily - 7 x weekly - 1 sets - 30 reps - 3 seconds hold - Standing Scapular Retraction  - 5 x daily - 7 x weekly - 1 sets - 5 reps - 5 second hold - Shoulder Internal Rotation with Resistance  - 1 x daily - 7 x weekly - 1 sets - 10 reps - 3 seconds hold - Shoulder External Rotation with Anchored Resistance  - 1 x daily - 7 x weekly - 2 sets - 10 reps - 3 seconds hold - Supine Shoulder Internal Rotation  - 1 x daily - 7 x weekly - 1 sets - 10 reps - 10 seconds hold - Standing Shoulder Posterior Capsule Stretch  - 1 x daily - 7 x weekly - 1 sets - 5 reps - 10 hold - Standing Shoulder Internal Rotation Stretch with Hands Behind Back  - 1 x daily - 7 x weekly - 1 sets - 10 reps -  10 seconds hold   ASSESSMENT:  CLINICAL IMPRESSION: Katie Morgan has made significant objective progress since starting physical therapy.  Only shoulder flexion active range of motion is not within the expected range.  This can be explained by impairments in posterior rotator cuff strength which is affecting her ability to complete full range overhead.  Only posterior rotator cuff strength is significantly off what it should be at this point in her rehabilitation.  Katie Morgan reports overall progress, except this week she has had significantly more right shoulder pain.  We focused down her home exercise program to focus a bit more on scapular and rotator cuff strength and discussed reassessing in 2 weeks to make additional recommendations.  If pain is minimal and  posterior rotator cuff strength is as expected, she should be ready for transfer into independent rehabilitation.  With adequate home program compliance, if posterior rotator cuff strength is still significantly shy of long-term goals, she will be referred back to Dr. Shon Baton for possible MRI as she may be a candidate for arthroscopic surgery given her type II acromion.  Patient is a 38 y.o. female who was seen today for physical therapy evaluation and treatment for  Diagnosis  M25.511,G89.29 (ICD-10-CM) - Chronic right shoulder pain  M75.31 (ICD-10-CM) - Calcific tendinitis of right shoulder  .  Katie Morgan has a history of bilateral shoulder pain.  She has received 2 cortisone injections into each shoulder in the past, has been told she has a type II acromion and has significant calcific changes on imaging.  Her right shoulder has active range of motion, strength and functional impairments which will be addressed with supervised physical therapy.  Her prognosis to improve the physical therapy is good, although I did let her know that given her imaging, she may be referred back to Dr. Shon Baton or an orthopedic specialist if significant symptomatic progress is not noted in the first 4 weeks of physical therapy  OBJECTIVE IMPAIRMENTS: decreased activity tolerance, decreased endurance, decreased knowledge of condition, decreased ROM, decreased strength, decreased safety awareness, increased edema, impaired perceived functional ability, impaired UE functional use, and pain.   ACTIVITY LIMITATIONS: carrying, lifting, sleeping, and reach over head  PARTICIPATION LIMITATIONS: community activity and occupation  PERSONAL FACTORS: Obesity, anemia, HTN are also affecting patient's functional outcome.   REHAB POTENTIAL: Good  CLINICAL DECISION MAKING: Stable/uncomplicated  EVALUATION COMPLEXITY: Low   GOALS: Goals reviewed with patient? Yes  SHORT TERM GOALS: Target date: 09/03/2023  Marilee will be  independent with her day 1 HEP Baseline: Started 08/06/2023 Goal status: Met 08/20/2023  2.  Improve shoulder AROM (Lt/Rt in degrees) for flexion to 170/170; ER to 105/100; IR to 70/60 and horizontal adduction to 40/40 Baseline: 170/135; 105/90; 70/30 and 35/25 respectively Goal status: Partially met 09/23/2023  3.  Improve shoulder strength (Lt/Rt in pounds) for ER to 20/20 and IR to 25/25 Baseline: 19.9/11.1 and 17.4/12.2 respectively Goal status: Partially Met 09/23/2023   LONG TERM GOALS: Target date: 10/01/2023  Katie Morgan will report right shoulder pain consistently 0-3/10 on the Numeric Pain Rating Scale Baseline: 0-6/10 Goal status: On Going 09/23/2023  2.  Katie Morgan will rate herself 80% or better on the Patient Specific Functional Scale. Baseline: Reach to top shelf, sleep 10/20 Goal status: On Going 09/23/2023  3.  Improve shoulder strength (Lt/Rt in pounds) for ER to 23/23 and IR to 30/30 Baseline: See STG Goal status: On Going 09/23/2023 4.  Katie Morgan will be independent with her long-term HEP at DC. Baseline: Started 08/06/2023 Goal  status: On Going 09/23/2023  PLAN:  PT FREQUENCY: 1-2x/week  PT DURATION: 8 weeks  PLANNED INTERVENTIONS: 97110-Therapeutic exercises, 97530- Therapeutic activity, 97112- Neuromuscular re-education, 97535- Self Care, 19147- Manual therapy, 97016- Vasopneumatic device, Patient/Family education, Joint mobilization, Cryotherapy, and Moist heat  PLAN FOR NEXT SESSION: Will need a recertification to Dr. Shon Baton.  If posterior rotator cuff strength is good, consider DC into independent rehabilitation.  Posterior rotator cuff strength is still poor, refer back to Dr. Shon Baton for MRI and possible arthroscopy.  Cherlyn Cushing, PT, MPT 09/23/2023, 4:46 PM

## 2023-10-01 ENCOUNTER — Other Ambulatory Visit: Payer: Self-pay | Admitting: Nurse Practitioner

## 2023-10-04 NOTE — Telephone Encounter (Signed)
 Do you want the pt. To stay on this. It looks like it has been a while since her last Vit D check.

## 2023-10-05 ENCOUNTER — Other Ambulatory Visit: Payer: Self-pay | Admitting: Nurse Practitioner

## 2023-10-05 MED ORDER — VITAMIN D (ERGOCALCIFEROL) 1.25 MG (50000 UNIT) PO CAPS: 50000.0000 [IU] | ORAL_CAPSULE | ORAL | 0 refills | Status: DC

## 2023-10-05 NOTE — Telephone Encounter (Signed)
 Copied from CRM [id number]. Topic: Clinical - Medication Refill >> Oct 05, 2023  9:16 AM Everette C wrote: Most Recent Primary Care Visit:  Provider: EARLY, SARA E  Department: [facility]  Visit Type: PHYSICAL 45  Date: 06/28/2023  Medication: Vitamin D , Ergocalciferol , (DRISDOL ) 1.25 MG (50000 UNIT) CAPS capsule [[id number]]  Has the patient contacted their pharmacy? Yes (Agent: If no, request that the patient contact the pharmacy for the refill. If patient does not wish to contact the pharmacy document the reason why and proceed with request.) (Agent: If yes, when and what did the pharmacy advise?)  Is this the correct pharmacy for this prescription? Yes If no, delete pharmacy and type the correct one.  This is the patient's preferred pharmacy:  [id number] - [place], [place] - [address] AT [facility] [address] [place] [address] Phone: [phone] Fax: [phone]   Has the prescription been filled recently? Yes  Is the patient out of the medication? Yes  Has the patient been seen for an appointment in the last year OR does the patient have an upcoming appointment? Yes  Can we respond through MyChart? No  Agent: Please be advised that Rx refills may take up to 3 business days. We ask that you follow-up with your pharmacy.

## 2023-10-06 ENCOUNTER — Telehealth: Payer: Self-pay | Admitting: Rehabilitative and Restorative Service Providers"

## 2023-10-06 ENCOUNTER — Other Ambulatory Visit: Payer: Self-pay

## 2023-10-06 ENCOUNTER — Encounter: Admitting: Rehabilitative and Restorative Service Providers"

## 2023-10-06 DIAGNOSIS — E559 Vitamin D deficiency, unspecified: Secondary | ICD-10-CM

## 2023-10-06 NOTE — Telephone Encounter (Signed)
 Rescheduled Nathan to 10/28/2023 at 4 PM.

## 2023-10-08 ENCOUNTER — Other Ambulatory Visit: Payer: Self-pay | Admitting: Nurse Practitioner

## 2023-10-08 DIAGNOSIS — K219 Gastro-esophageal reflux disease without esophagitis: Secondary | ICD-10-CM

## 2023-10-28 ENCOUNTER — Encounter: Admitting: Rehabilitative and Restorative Service Providers"

## 2023-10-29 ENCOUNTER — Ambulatory Visit (INDEPENDENT_AMBULATORY_CARE_PROVIDER_SITE_OTHER): Admitting: Rehabilitative and Restorative Service Providers"

## 2023-10-29 ENCOUNTER — Encounter: Payer: Self-pay | Admitting: Rehabilitative and Restorative Service Providers"

## 2023-10-29 DIAGNOSIS — R600 Localized edema: Secondary | ICD-10-CM | POA: Diagnosis not present

## 2023-10-29 DIAGNOSIS — M25511 Pain in right shoulder: Secondary | ICD-10-CM | POA: Diagnosis not present

## 2023-10-29 DIAGNOSIS — M6281 Muscle weakness (generalized): Secondary | ICD-10-CM

## 2023-10-29 DIAGNOSIS — R293 Abnormal posture: Secondary | ICD-10-CM

## 2023-10-29 DIAGNOSIS — G8929 Other chronic pain: Secondary | ICD-10-CM

## 2023-10-29 DIAGNOSIS — M25611 Stiffness of right shoulder, not elsewhere classified: Secondary | ICD-10-CM

## 2023-10-29 NOTE — Therapy (Signed)
 OUTPATIENT PHYSICAL THERAPY SHOULDER TREATMENT/DISCHARGE  PHYSICAL THERAPY DISCHARGE SUMMARY  Visits from Start of Care: 5  Current functional level related to goals / functional outcomes: See note   Remaining deficits: See note   Education / Equipment: Updated HEP   Patient agrees to discharge. Patient goals were partially met. Patient is being discharged due to Need for additional medical management.   Patient Name: Katie Morgan MRN: [id number] DOB:07-15-1985, 38 y.o., female Today's Date: 10/29/2023  END OF SESSION:  PT End of Session - 10/29/23 1436     Visit Number 5    Number of Visits 12    Date for PT Re-Evaluation 10/01/23    Progress Note Due on Visit 12    PT Start Time 1300    PT Stop Time 1339    PT Time Calculation (min) 39 min    Activity Tolerance Patient tolerated treatment well;No increased pain;Patient limited by pain    Behavior During Therapy [facility] for tasks assessed/performed              Past Medical History:  Diagnosis Date   Anemia    Morbid obesity (HCC)    Routine screening for STI (sexually transmitted infection) 05/11/2023   Severe obesity (BMI >= 40) (HCC) 04/06/2016   Shoulder impingement, left 06/11/2022   Vaginal laceration, initial encounter 02/11/2023   History reviewed. No pertinent surgical history. 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/06/2016    PCP: Annella Kief, NP  REFERRING PROVIDER: Shauna Del, DO  REFERRING DIAG:  Diagnosis  M25.511,G89.29 (ICD-10-CM) - Chronic right shoulder pain  M75.31 (ICD-10-CM) - Calcific tendinitis of right shoulder    THERAPY DIAG:  Abnormal posture - Plan: PT plan of care cert/re-cert  Muscle weakness (generalized) - Plan: PT plan of care cert/re-cert  Localized edema - Plan: PT plan of care cert/re-cert  Chronic right shoulder pain - Plan: PT plan of care cert/re-cert  Stiffness of right shoulder, not elsewhere classified - Plan: PT plan of care cert/re-cert  Rationale for Evaluation and Treatment: Rehabilitation  ONSET DATE: Chronic, 2 + years  SUBJECTIVE:  SUBJECTIVE STATEMENT: Katie Morgan reports overall progress with her AROM and pain.  Sleep is improved as long as she stays off the right side.  She reports good HEP compliance up to an illness 2 weeks ago.  "Fair" HEP compliance since that time.  Pain has been more evident over the past week and certain things with her right shoulder (reaching and overhead function) remain limited.  Katie Morgan notes 2 previous left shoulder injections.  She also received 1 on the right and one last summer in the right shoulder.  Hand dominance: Right  PERTINENT HISTORY: Obesity, anemia, HTN  PAIN:  Are you having pain? Yes: NPRS scale: 0-4/10 this week (was 6/10) Pain location: Anterior shoulder, was Upper right lateral arm Pain description: Ache, Can be sharp Aggravating factors: Lying on the right side, quick movements, impingement position Relieving factors: Get out of that position  PRECAUTIONS: Shoulder, impingement  RED FLAGS: None   WEIGHT BEARING RESTRICTIONS: No  FALLS:  Has patient fallen in last 6 months? No  LIVING ENVIRONMENT: Lives with: lives with their family and mom Lives in:  House/apartment Stairs: No problem Has following equipment at home: None  OCCUPATION: Builds airplanes  PLOF: Independent  PATIENT GOALS: Be able to function overhead without pain, more comfortable mornings.  NEXT MD VISIT: NA  OBJECTIVE:  Note: Objective measures were completed at Evaluation unless otherwise noted.  DIAGNOSTIC FINDINGS:  4 views of the right shoulder including AP, Grashey, scapular Y and axial  view were ordered and reviewed by myself today.  X-rays show a calcific  density that is about 0.8 cm in size literally just off the greater  tuberosity, likely in the location of the rotator cuff/supraspinatus  insertion.  The humeral head is well located with good space at the  subacromial outlet.  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  No other acute fracture or other bony abnormality  noted.  PATIENT SURVEYS:  10/29/2023: Patient Specific Functional Scale (PSFS): 80% subjectively 10/29/2023  09/23/2023: Patient Specific Functional Scale (PSFS): 70% subjectively 09/01/2023  Eval: Patient Specific Functional Scale (PSFS): 80% subjectively 09/01/2023  COGNITION: Overall cognitive status: Within functional limits for tasks assessed     SENSATION: WFL  POSTURE: Mildly flexed  UPPER EXTREMITY ROM:   Passive ROM Left/Right 08/06/2023 Right 09/01/2023 Right 09/23/2023 Right 10/29/2023  Shoulder flexion 170/135 140 150 150  Shoulder extension      Shoulder abduction      Shoulder horizontal adduction 35/25 30 40 40  Shoulder internal rotation 70/30 65 60 70  Shoulder external rotation 105/90 90 95 100  Elbow flexion      Elbow extension      Wrist flexion      Wrist extension      Wrist ulnar deviation      Wrist radial deviation      Wrist pronation      Wrist supination      (Blank rows = not tested)  UPPER EXTREMITY STRENGTH:  Assessed with hand-held dynamometer in pounds Left/Right 08/06/2023 Right 09/01/2023 Right 09/23/2023  Right 10/29/2023  Shoulder flexion      Shoulder extension      Shoulder abduction      Shoulder adduction      Shoulder internal rotation 19.9/11.1 15.7 24.9 25.0  Shoulder external rotation 17.4/12.7 15.4 12.9 11.1  Middle trapezius      Lower trapezius      Elbow flexion      Elbow extension  Wrist flexion      Wrist extension      Wrist ulnar deviation      Wrist radial deviation      Wrist pronation      Wrist supination      Grip strength (lbs)      (Blank rows = not tested)                                                                                                                             TREATMENT DATE:  10/29/2023 Scapular retraction/shoulder blade pinches 5 x 5 seconds Thera-Band ER Green 2 sets of 10 with slow eccentrics Thera-Band IR Green 2 sets of 10 with slow eccentrics Supine IR stretch (70 degrees abduction, elbows on pillows) 10 x 10 seconds  Functional Activities: Scapular protraction/supine arm raise 20 x 3 seconds with 7# (reaching) Thumb up the back (reaching behind the back) 5 x 10 seconds Posterior capsule stretch 5 x 10 seconds (reaching across the body) Reviewed objective measurements, discussed the possibility/probability of a rotator cuff tear and recommendations to follow back up with Dr. Vaughn Georges and modified her current home exercise program based on objective assessment today.  Discussed questions about possible surgery.   09/23/2023 Scapular retraction/shoulder blade pinches 5 x 5 seconds Thera-Band ER Green 2 sets of 10 with slow eccentrics Thera-Band IR Green 2 sets of 10 with slow eccentrics Thera-Band Rows Blue 20 x 3 seconds Supine IR stretch (70 degrees abduction, elbows on pillows) 10 x 10 seconds  Functional Activities: Scapular protraction/supine arm raise 20 x 3 seconds with 6# (reaching) Thumb up the back (reaching behind the back) 10 x 10 seconds Posterior capsule stretch 10 x 10 seconds (reaching across the  body) Reviewed objective measurements and modified her current home exercise program based on objective assessment today.  Discussed questions about possible surgery will be easier to answer in 2 weeks when we get additional objective strength measurements.   09/01/2023 Scapular retraction/shoulder blade pinches 5 x 5 seconds Thera-Band ER Green 2 sets of 10 with slow eccentrics Thera-Band IR Green 2 sets of 10 with slow eccentrics Thera-Band Rows Blue 20 x 3 seconds Supine IR stretch (70 degrees abduction, elbows on pillows) 10 x 10 seconds  Functional Activities: Scapular protraction/supine arm raise 20 x 3 seconds with 6# (reaching) Thumb up the back (reaching behind the back) 5 x 10 seconds 10 x 10 seconds Posterior capsule stretch 5 x 10 seconds (reaching across the body) Review and modify her current home exercise program based on objective assessment today   PATIENT EDUCATION: Education details: See above Person educated: Patient Education method: Explanation, Demonstration, Tactile cues, Verbal cues, and Handouts Education comprehension: verbalized understanding, returned demonstration, verbal cues required, tactile cues required, and needs further education  HOME EXERCISE PROGRAM: Access Code: [email or web address] URL: [email or web address] Date: 10/29/2023 Prepared by: Terral Ferrari  Exercises - Supine Scapular Protraction in Flexion with Dumbbells  - 1 x daily - 3  x weekly - 1 sets - 30 reps - 3 seconds hold - Standing Scapular Retraction  - 5 x daily - 7 x weekly - 1 sets - 5 reps - 5 second hold - Shoulder Internal Rotation with Resistance  - 1 x daily - 3 x weekly - 1 sets - 10 reps - 3 seconds hold - Shoulder External Rotation with Anchored Resistance  - 1 x daily - 7 x weekly - 2 sets - 10 reps - 3 seconds hold - Supine Shoulder Internal Rotation  - 1 x daily - 7 x weekly - 1 sets - 10 reps - 10 seconds hold - Standing Shoulder Posterior Capsule Stretch  - 1 x  daily - 7 x weekly - 1 sets - 5 reps - 10 hold - Standing Shoulder Internal Rotation Stretch with Hands Behind Back  - 1 x daily - 7 x weekly - 1 sets - 5 reps - 10 seconds hold   ASSESSMENT:  CLINICAL IMPRESSION: Denym has made significant objective progress since starting physical therapy.  Only shoulder flexion active range of motion is not as expected.  I believe this is due to to her posterior rotator cuff not assisting in the inferior glide of the humeral head during reaching and overhead function due to a possible rotator cuff tear.  Posterior rotator cuff strength is significantly off what it should be at this point in her rehabilitation and I encouraged Reachel to follow-up with Dr. Vaughn Georges about a possible rotator cuff tear to get his opinion and for possible additional imaging (MRI).  Regardless of whether she has a tear or not, Kortny may be a candidate for arthroscopic surgery given her type II acromion.  Patient is a 39 y.o. female who was seen today for physical therapy evaluation and treatment for  Diagnosis  M25.511,G89.29 (ICD-10-CM) - Chronic right shoulder pain  M75.31 (ICD-10-CM) - Calcific tendinitis of right shoulder  .  Minha has a history of bilateral shoulder pain.  She has received 2 cortisone injections into each shoulder in the past, has been told she has a type II acromion and has significant calcific changes on imaging.  Her right shoulder has active range of motion, strength and functional impairments which will be addressed with supervised physical therapy.  Her prognosis to improve the physical therapy is good, although I did let her know that given her imaging, she may be referred back to Dr. Vaughn Georges or an orthopedic specialist if significant symptomatic progress is not noted in the first 4 weeks of physical therapy  OBJECTIVE IMPAIRMENTS: decreased activity tolerance, decreased endurance, decreased knowledge of condition, decreased ROM, decreased strength, decreased  safety awareness, increased edema, impaired perceived functional ability, impaired UE functional use, and pain.   ACTIVITY LIMITATIONS: carrying, lifting, sleeping, and reach over head  PARTICIPATION LIMITATIONS: community activity and occupation  PERSONAL FACTORS: Obesity, anemia, HTN are also affecting patient's functional outcome.   REHAB POTENTIAL: Good  CLINICAL DECISION MAKING: Stable/uncomplicated  EVALUATION COMPLEXITY: Low   GOALS: Goals reviewed with patient? Yes  SHORT TERM GOALS: Target date: 09/03/2023  Angelinna will be independent with her day 1 HEP Baseline: Started 08/06/2023 Goal status: Met 08/20/2023  2.  Improve shoulder AROM (Lt/Rt in degrees) for flexion to 170/170; ER to 105/100; IR to 70/60 and horizontal adduction to 40/40 Baseline: 170/135; 105/90; 70/30 and 35/25 respectively Goal status: Partially met 09/23/2023  3.  Improve shoulder strength (Lt/Rt in pounds) for ER to 20/20 and IR to 25/25 Baseline: 19.9/11.1  and 17.4/12.2 respectively Goal status: Partially Met 09/23/2023   LONG TERM GOALS: Target date: 10/01/2023  Chaselynn will report right shoulder pain consistently 0-3/10 on the Numeric Pain Rating Scale Baseline: 0-6/10 Goal status: On Going 10/29/2023  2.  Macle will rate herself 80% or better on the Patient Specific Functional Scale. Baseline: Reach to top shelf, sleep 10/20 Goal status: Met 10/29/2023  3.  Improve shoulder strength (Lt/Rt in pounds) for ER to 23/23 and IR to 30/30 Baseline: See STG Goal status: On Going 10/29/2023  4.  Philomena will be independent with her long-term HEP at DC. Baseline: Started 08/06/2023 Goal status: Met 10/29/2023 PLAN:  PT FREQUENCY: Referred back to Dr. Vaughn Georges for additional imaging and medical management  PT DURATION: DC  PLANNED INTERVENTIONS: 97110-Therapeutic exercises, 97530- Therapeutic activity, 97112- Neuromuscular re-education, 97535- Self Care, 09811- Manual therapy, 97016- Vasopneumatic  device, Patient/Family education, Joint mobilization, Cryotherapy, and Moist heat  PLAN FOR NEXT SESSION: DC  Joli Neas, PT, MPT 10/29/2023, 2:55 PM

## 2023-11-11 ENCOUNTER — Encounter: Payer: Self-pay | Admitting: Sports Medicine

## 2023-11-11 ENCOUNTER — Ambulatory Visit: Admitting: Sports Medicine

## 2023-11-11 DIAGNOSIS — G8929 Other chronic pain: Secondary | ICD-10-CM | POA: Diagnosis not present

## 2023-11-11 DIAGNOSIS — M25511 Pain in right shoulder: Secondary | ICD-10-CM | POA: Diagnosis not present

## 2023-11-11 DIAGNOSIS — M7531 Calcific tendinitis of right shoulder: Secondary | ICD-10-CM

## 2023-11-11 NOTE — Progress Notes (Signed)
 Patient says that she did get about a month and a half of relief from the injection at her last visit. She has finished physical therapy and did notice some improvements with her range of motion. She describes limitation in ROM of shoulder abduction of the right shoulder when compared to the left shoulder. She says that she was advised at her last physical therapy appointment to follow up in the office about potentially getting an MRI of the right shoulder.

## 2023-11-11 NOTE — Progress Notes (Signed)
 Katie Morgan - 38 y.o. female MRN [id number]  Date of birth: Nov 24, 1985  Office Visit Note: Visit Date: 11/11/2023 PCP: Annella Kief, NP Referred by: Annella Kief, NP  Subjective: Chief Complaint  Patient presents with   Right Shoulder - Follow-up   HPI: Katie Morgan is a pleasant 38 y.o. female who presents today for follow-up of chronic right shoulder pain with calcific tendinitis.  Her right shoulder is certainly doing better than when I saw her in early February.  On 07/20/2023 we did perform an ultrasound-guided lateral shoulder injection into the subacromial bursa of the shoulder in the region of her calcific density.  She had really good relief for the next 1.5-2 months, but then she started physical therapy and after a few sessions she feels like this exacerbated her pain.  She has pain specifically with abduction and sometimes with sleeping when she sleeps on the contralateral shoulder.  Does have some limitation in range of motion with abduction compared to the left side although this is improved from previous appointments and injection.  Her PT talked about possibly obtaining an MRI or seeing the back here for further evaluation.  In the past she has taken oral diclofenac  75 mg as needed for her hand/finger but did notice this helped her shoulder as well.  Pertinent ROS were reviewed with the patient and found to be negative unless otherwise specified above in HPI.   Assessment & Plan: Visit Diagnoses:  1. Chronic right shoulder pain   2. Calcific tendinitis of right shoulder    Plan: Impression is chronic right shoulder pain with evidence of calcific density, likely calcific tendinitis in the region of the rotator cuff (?supraspinatus).  She did receive good relief from prior injection into this region of the shoulder and around the calcific density, although formalized physical therapy seem to exacerbate her pain.  We discussed all treatment options including oral  medication, MRI to help further quantify the exact location of the calcific density, possible surgical intervention that could be performed, as well as extracorporeal shockwave therapy. I did show her shockwave treatment and discussed the general overview today with her in the room.  She would like to move forward with a few sessions of shockwave therapy in attempt to break up the calcification and provide pain relief and improvement with range of motion.  We will set her up for at least 2-3 treatments and then see what sort of cumulative benefit she has going forward before deciding on additional treatment.  May use her oral diclofenac  or ibuprofen/Motrin only as needed.  I would like her to stop her physical therapy exercises as it feels these are worsening her symptoms.  She will schedule an appointment sometime over the next 2 weeks for a trial of extracorporeal shockwave therapy and reevaluation.  Follow-up: Return for Schedule 2 consecutive in next 1-2 weeks for R-shoulder ESWT (reg visit).   Meds & Orders: No orders of the defined types were placed in this encounter.  No orders of the defined types were placed in this encounter.    Procedures: No procedures performed      Clinical History: No specialty comments available.  She reports that she has never smoked. She has never used smokeless tobacco. No results for input(s): "HGBA1C", "LABURIC" in the last 8760 hours.  Objective:    Physical Exam  Gen: Well-appearing, in no acute distress; non-toxic CV: Well-perfused. Warm.  Resp: Breathing unlabored on room air; no wheezing. Psych: Fluid  speech in conversation; appropriate affect; normal thought process  Ortho Exam - Right shoulder: Positive TTP near Codman's point on the right shoulder.  There is no redness swelling or effusion.  She has restriction with flexion actively but I am able to take her to full range of motion passively.  She has pain both actively and passively with  abduction at about the 135 degree mark. + Drop arm, equivocal empty can test.  There is pain with resisted external rotation although no weakness with external/internal or other rotator cuff testing.  Positive Hawkins impingement testing.  Imaging:  XR Shoulder Right 4 views of the right shoulder including AP, Grashey, scapular Y and axial  view were ordered and reviewed by myself today.  X-rays show a calcific  density that is about 0.8 cm in size literally just off the greater  tuberosity, likely in the location of the rotator cuff/supraspinatus  insertion.  The humeral head is well located with good space at the  subacromial outlet.  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  No other acute fracture or other bony abnormality  noted.  Past Medical/Family/Surgical/Social History: Medications & Allergies reviewed per EMR, new medications updated. 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/06/2016   Past Medical History:  Diagnosis Date   Anemia    Morbid obesity (HCC)    Routine screening for STI (sexually transmitted infection) 05/11/2023   Severe obesity (BMI >= 40) (HCC) 04/06/2016   Shoulder impingement, left 06/11/2022   Vaginal laceration, initial encounter 02/11/2023   Family History  Problem Relation Age of Onset   Breast cancer Mother    Obesity Mother    Heart failure Mother    Hyperlipidemia Mother    Obesity Father    Heart  failure Father    Breast cancer Maternal Aunt 53   Breast cancer Maternal Grandmother    Cancer Maternal Grandmother        Leu Gehrigs   Stroke Neg Hx    Hypertension Neg Hx    Diabetes Mellitus II Neg Hx    Colon cancer Neg Hx    History reviewed. No pertinent surgical history. Social History   Occupational History   Not on file  Tobacco Use   Smoking status: Never   Smokeless tobacco: Never  Vaping Use   Vaping status: Never Used  Substance and Sexual Activity   Alcohol use: No   Drug use: No   Sexual activity: Not Currently   I spent 31 minutes in the care of the patient today including face-to-face time, preparation to see the patient, as well as review of previous x-ray imaging and ultrasound guidance, review of previous sports medicine/orthopedic notes and treatment, discussion on need and evaluation of MRI, discussion on extracorporeal shockwave therapy, other possible surgical intervention that may be required for the above diagnoses.   Shauna Del, DO Primary Care Sports Medicine Physician  [facility] - Orthopedics  This note was dictated using  Dragon naturally speaking software and may contain errors in syntax, spelling, or content which have not been identified prior to signing this note.

## 2023-11-24 ENCOUNTER — Encounter: Admitting: Rehabilitative and Restorative Service Providers"

## 2023-11-26 ENCOUNTER — Ambulatory Visit: Admitting: Sports Medicine

## 2023-11-26 ENCOUNTER — Encounter: Payer: Self-pay | Admitting: Sports Medicine

## 2023-11-26 DIAGNOSIS — G8929 Other chronic pain: Secondary | ICD-10-CM | POA: Diagnosis not present

## 2023-11-26 DIAGNOSIS — M25511 Pain in right shoulder: Secondary | ICD-10-CM

## 2023-11-26 DIAGNOSIS — M7531 Calcific tendinitis of right shoulder: Secondary | ICD-10-CM | POA: Diagnosis not present

## 2023-11-26 NOTE — Progress Notes (Signed)
 Patient says that she is having the same discomfort and catching that she was having at her last visit, but is not feeling much pain in the shoulder. She does have a second appointment for shockwave therapy scheduled for next Friday.

## 2023-11-26 NOTE — Progress Notes (Signed)
 Katie Morgan - 38 y.o. female MRN [id number]  Date of birth: June 30, 1985  Office Visit Note: Visit Date: 11/26/2023 PCP: Annella Kief, NP Referred by: Annella Kief, NP  Subjective: Chief Complaint  Patient presents with   Right Shoulder - Pain   HPI: Katie Morgan is a pleasant 38 y.o. female who presents today for follow-up of chronic right shoulder pain with calcific tendinitis.  [name] is still having some discomfort and catching in the shoulder with certain reaching motions but she is not having much pain in the shoulder since our last 2 visits.  She is interested in proceeding with shockwave therapy.  She has used about 2 doses of diclofenac  orally which has helped to some degree, although not needing or using this consistently.  Pertinent ROS were reviewed with the patient and found to be negative unless otherwise specified above in HPI.   Assessment & Plan: Visit Diagnoses:  1. Calcific tendinitis of right shoulder   2. Chronic right shoulder pain    Plan: Impression is chronic right shoulder pain with evidence of calcific density, likely from underlying calcific tendinitis in the rotator cuff (suspected supraspinatus).  After discussion, we did proceed with our first trial of extracorporeal shockwave therapy to help break up the calcification and treat the underlying rotator cuff.  I would like her to proceed with 2-3 total treatments and then see what sort of cumulative benefit she has going forward before proceeding on additional treatments.  She will hold on her physical therapy/HEP until she has further improvement.  Follow-up next week for repeat shockwave and further evaluation.  Has her diclofenac  orally to take only as needed.  Follow-up: Return in about 1 week (around 12/03/2023) for R-shoulder .   Meds & Orders: No orders of the defined types were placed in this encounter.  No orders of the defined types were placed in this encounter.     Procedures: Procedure: ECSWT Indications: Calcific tendinitis of the right shoulder   Procedure Details Consent: Risks of procedure as well as the alternatives and risks of each were explained to the patient.  Verbal consent for procedure obtained. Time Out: Verified patient identification, verified procedure, site was marked, verified correct patient position. The area was cleaned with alcohol swab.     The right shoulder near the rotator cuff interval was targeted for Extracorporeal shockwave therapy.    Preset: Calcific tendinitis of the shoulder Power Level: 100 mJ Frequency: 12 Hz Impulse/cycles: 3000 Head size: Regular   Patient tolerated procedure well without immediate complications.       Clinical History: No specialty comments available.  She reports that she has never smoked. She has never used smokeless tobacco. No results for input(s): HGBA1C, LABURIC in the last 8760 hours.  Objective:    Physical Exam  Gen: Well-appearing, in no acute distress; non-toxic CV: Well-perfused. Warm.  Resp: Breathing unlabored on room air; no wheezing. Psych: Fluid speech in conversation; appropriate affect; normal thought process  Ortho Exam - Right shoulder: + TTP near Codman's point of the right shoulder.  There is no redness swelling or effusion.  There is some pain with endrange abduction testing.  Imaging: No results found.  Past Medical/Family/Surgical/Social History: Medications & Allergies reviewed per EMR, new medications updated. 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   Past Medical History:  Diagnosis Date   Anemia    Morbid obesity (HCC)    Routine screening for STI (sexually transmitted infection) 05/11/2023   Severe obesity (BMI >= 40) (HCC) 04/06/2016   Shoulder impingement, left 06/11/2022   Vaginal laceration, initial encounter 02/11/2023   Family History  Problem Relation Age of Onset   Breast cancer Mother    Obesity Mother    Heart failure Mother    Hyperlipidemia Mother    Obesity Father    Heart failure Father    Breast cancer Maternal Aunt 35   Breast cancer Maternal Grandmother    Cancer Maternal Grandmother        Leu Gehrigs   Stroke Neg Hx    Hypertension Neg Hx    Diabetes Mellitus II Neg Hx    Colon cancer Neg Hx    History reviewed. No pertinent surgical history. Social History   Occupational History   Not on file  Tobacco Use   Smoking status: Never   Smokeless tobacco: Never  Vaping Use   Vaping status: Never Used  Substance and Sexual Activity   Alcohol use: No   Drug use: No   Sexual activity: Not Currently

## 2023-12-03 ENCOUNTER — Encounter: Payer: Self-pay | Admitting: Sports Medicine

## 2023-12-03 ENCOUNTER — Ambulatory Visit: Admitting: Sports Medicine

## 2023-12-03 DIAGNOSIS — M25511 Pain in right shoulder: Secondary | ICD-10-CM | POA: Diagnosis not present

## 2023-12-03 DIAGNOSIS — G8929 Other chronic pain: Secondary | ICD-10-CM | POA: Diagnosis not present

## 2023-12-03 DIAGNOSIS — M7531 Calcific tendinitis of right shoulder: Secondary | ICD-10-CM

## 2023-12-03 NOTE — Progress Notes (Signed)
 Katie Morgan - 38 y.o. female MRN [id number]  Date of birth: January 15, 1986  Office Visit Note: Visit Date: 12/03/2023 PCP: Annella Kief, NP Referred by: Annella Kief, NP  Subjective: Chief Complaint  Patient presents with   Right Shoulder - Follow-up   HPI: Katie Morgan is a pleasant 38 y.o. female who presents today for f/u of right shoulder pain with calcific tendinopathy.  Katie Morgan underwent her first treatment of extracorporeal shockwave therapy 1 week ago.  She had some soreness for the first 24-48 hours but after this she started noticing improvement.  She has much better range of motion and certainly less pain with reaching motions about the shoulder.  In general feels like she is about 50% improved.  Yesterday and today she is somewhat more sore.  Used diclofenac  PO once since last visit. Holding from PT.  Pertinent ROS were reviewed with the patient and found to be negative unless otherwise specified above in HPI.   Assessment & Plan: Visit Diagnoses:  1. Calcific tendinitis of right shoulder   2. Chronic right shoulder pain    Plan: Impression is chronic right shoulder pain with evidence of calcific density from likely underlying calcific tendinitis (position of supraspinatus).  She does feel like she is about 50% improved after 1 treatment of extracorporeal shockwave therapy.  We did repeat this today, patient tolerated well.  We will see her back in about 10 days for an additional treatment as well as reevaluation.  Discussed shockwave therapy helping to break up the calcification and promote augmentation of tendon healing of the rotator cuff interval itself.  She may use oral diclofenac  as needed only.  Follow-up in next 1.5 weeks.   Follow-up: Return for 10-days for shoulder f/u .   Meds & Orders: No orders of the defined types were placed in this encounter.  No orders of the defined types were placed in this encounter.    Procedures: Procedure:  ECSWT Indications: Calcific tendinitis of the right shoulder   Procedure Details Consent: Risks of procedure as well as the alternatives and risks of each were explained to the patient.  Verbal consent for procedure obtained. Time Out: Verified patient identification, verified procedure, site was marked, verified correct patient position. The area was cleaned with alcohol swab.     The right shoulder near the rotator cuff interval was targeted for Extracorporeal shockwave therapy.    Preset: Calcific tendinitis of the shoulder Power Level: 90-100 mJ  Frequency: 12 Hz Impulse/cycles: 3000 Head size: Regular   Patient tolerated procedure well without immediate complications.      Clinical History: No specialty comments available.  She reports that she has never smoked. She has never used smokeless tobacco. No results for input(s): HGBA1C, LABURIC in the last 8760 hours.  Objective:    Physical Exam  Gen: Well-appearing, in no acute distress; non-toxic CV: Well-perfused. Warm.  Resp: Breathing unlabored on room air; no wheezing. Psych: Fluid speech in conversation; appropriate affect; normal thought process  Ortho Exam - Right shoulder: + TTP at Codman's point and with Hawkins impingement testing.  There is full range of motion and good preservation of strength of the rotator cuff.  Imaging: No results found.  Past Medical/Family/Surgical/Social History: Medications & Allergies reviewed per EMR, new medications updated. Patient Active Problem List   Diagnosis Date Noted   Gastroesophageal reflux disease 06/28/2023   Encounter for annual physical exam 05/11/2023   Hypertension 04/29/2023   Piriformis syndrome, right 04/29/2023  Family history of breast cancer in first degree relative 10/26/2022   Elevated lipids 11/06/2021   Chronic right shoulder pain 11/06/2021   Bilateral pendulous breasts 11/06/2021   Primary insomnia 11/06/2021   Vitamin D  deficiency 11/06/2021    BMI 45.0-49.9, adult (HCC) 11/06/2021   Anemia, iron deficiency 05/20/2016   Tinnitus of right ear 04/06/2016   Past Medical History:  Diagnosis Date   Anemia    Morbid obesity (HCC)    Routine screening for STI (sexually transmitted infection) 05/11/2023   Severe obesity (BMI >= 40) (HCC) 04/06/2016   Shoulder impingement, left 06/11/2022   Vaginal laceration, initial encounter 02/11/2023   Family History  Problem Relation Age of Onset   Breast cancer Mother    Obesity Mother    Heart failure Mother    Hyperlipidemia Mother    Obesity Father    Heart failure Father    Breast cancer Maternal Aunt 26   Breast cancer Maternal Grandmother    Cancer Maternal Grandmother        Leu Gehrigs   Stroke Neg Hx    Hypertension Neg Hx    Diabetes Mellitus II Neg Hx    Colon cancer Neg Hx    History reviewed. No pertinent surgical history. Social History   Occupational History   Not on file  Tobacco Use   Smoking status: Never   Smokeless tobacco: Never  Vaping Use   Vaping status: Never Used  Substance and Sexual Activity   Alcohol use: No   Drug use: No   Sexual activity: Not Currently

## 2023-12-03 NOTE — Progress Notes (Signed)
 Patient says that she is feeling a bit better. She feels that her ROM has improved. She says that she was able to sleep on her side which was an improvement. She was sore the day after the shockwave therapy, and is sore today, but felt good for the days between.

## 2023-12-13 ENCOUNTER — Ambulatory Visit: Admitting: Sports Medicine

## 2023-12-13 DIAGNOSIS — M25511 Pain in right shoulder: Secondary | ICD-10-CM | POA: Diagnosis not present

## 2023-12-13 DIAGNOSIS — G8929 Other chronic pain: Secondary | ICD-10-CM

## 2023-12-13 DIAGNOSIS — M7531 Calcific tendinitis of right shoulder: Secondary | ICD-10-CM | POA: Diagnosis not present

## 2023-12-13 NOTE — Progress Notes (Signed)
 Katie Morgan - 38 y.o. female MRN [id number]  Date of birth: Feb 25, 1986  Office Visit Note: Visit Date: 12/13/2023 PCP: Oris Camie BRAVO, NP Referred by: Oris Camie BRAVO, NP  Subjective: Chief Complaint  Patient presents with   Right Shoulder - Follow-up    Here for another shockwave therapy session. Says her shoulder is getting better. Last session was painful, but once subsided, felt the benefits.   HPI: Katie Morgan is a pleasant 38 y.o. female who presents today for f/u of right shoulder pain with calcific tendinopathy.  Katie Morgan has underwent 2 total treatments of extracorporeal shockwave therapy and has had good relief from both of these treatments.  After 2 sessions, she feels like she is at least 70-80% improved.  She is not having any pain at rest or with motion at this point, only has some pain at nighttime when rolling over on that shoulder in specific positions.   Once we are finished with shockwave and further treatment, she would like a referral back to PT to reassess her motion and her interval strength testing.  Pertinent ROS were reviewed with the patient and found to be negative unless otherwise specified above in HPI.   Assessment & Plan: Visit Diagnoses:  1. Calcific tendinitis of right shoulder   2. Chronic right shoulder pain    Plan: Impression is chronic right shoulder pain with evidence of calcific tendinopathy likely of the underlying supraspinatus tendon.  She has received significant relief from 2 treatments of extracorporeal shockwave therapy.  We did repeat a third treatment today, patient tolerated well.  At this point, we will hold on future sessions and she will send me a message over the next 10-14 days what degree of improvement she receives.  If she is 90+ percent improved, we will allow her to continue further healing on her own.  She is interested in going back to physical therapy for a final time for reassessment of her range of motion, strength  testing and other parameters once we are done with full treatment.  If she is better but not near full improvement, we could consider additional shockwave treatments in the future if needed.  Follow-up: Return for will send me mychart message update in 10-14 days.   Meds & Orders: No orders of the defined types were placed in this encounter.  No orders of the defined types were placed in this encounter.    Procedures: Procedure: ECSWT Indications: Calcific tendinitis of the right shoulder   Procedure Details Consent: Risks of procedure as well as the alternatives and risks of each were explained to the patient.  Verbal consent for procedure obtained. Time Out: Verified patient identification, verified procedure, site was marked, verified correct patient position. The area was cleaned with alcohol swab.     The right shoulder near the rotator cuff interval was targeted for Extracorporeal shockwave therapy.    Preset: Calcific tendinitis of the shoulder Power Level: 100 mJ  Frequency: 12 Hz Impulse/cycles: 3000 Head size: Regular   Patient tolerated procedure well without immediate complications.      Clinical History: No specialty comments available.  She reports that she has never smoked. She has never used smokeless tobacco. No results for input(s): HGBA1C, LABURIC in the last 8760 hours.  Objective:    Physical Exam  Gen: Well-appearing, in no acute distress; non-toxic CV: Well-perfused. Warm.  Resp: Breathing unlabored on room air; no wheezing. Psych: Fluid speech in conversation; appropriate affect; normal thought process  Ortho Exam - Right shoulder: + Mild TTP at Codman's point and over the insertional aspect of the supraspinatus tendon but improved from previous visits.  There is active full range of motion in all directions, mild reproduction of pain with Hawkins testing.  Strength testing not tested today.  Imaging: No results found.  Past  Medical/Family/Surgical/Social History: Medications & Allergies reviewed per EMR, new medications updated. 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/06/2016   Past Medical History:  Diagnosis Date   Anemia    Morbid obesity (HCC)    Routine screening for STI (sexually transmitted infection) 05/11/2023   Severe obesity (BMI >= 40) (HCC) 04/06/2016   Shoulder impingement, left 06/11/2022   Vaginal laceration, initial encounter 02/11/2023   Family History  Problem Relation Age of Onset   Breast cancer Mother    Obesity Mother    Heart failure Mother    Hyperlipidemia Mother    Obesity Father    Heart failure Father    Breast cancer Maternal Aunt 36   Breast cancer Maternal Grandmother    Cancer Maternal Grandmother        Leu Gehrigs   Stroke Neg Hx    Hypertension Neg Hx    Diabetes Mellitus II Neg Hx    Colon cancer Neg Hx    No past surgical history on file. Social History   Occupational History   Not on file  Tobacco Use   Smoking status: Never   Smokeless tobacco: Never  Vaping Use   Vaping status: Never Used  Substance and Sexual Activity   Alcohol use: No   Drug use: No   Sexual activity: Not Currently

## 2023-12-16 ENCOUNTER — Encounter: Payer: Self-pay | Admitting: Nurse Practitioner

## 2023-12-25 ENCOUNTER — Other Ambulatory Visit: Payer: Self-pay | Admitting: Nurse Practitioner

## 2024-02-03 ENCOUNTER — Other Ambulatory Visit: Payer: Self-pay | Admitting: Nurse Practitioner

## 2024-02-03 DIAGNOSIS — Z1231 Encounter for screening mammogram for malignant neoplasm of breast: Secondary | ICD-10-CM

## 2024-02-15 ENCOUNTER — Encounter: Payer: Self-pay | Admitting: Nurse Practitioner

## 2024-02-15 ENCOUNTER — Other Ambulatory Visit: Payer: Self-pay

## 2024-02-15 DIAGNOSIS — Z0000 Encounter for general adult medical examination without abnormal findings: Secondary | ICD-10-CM

## 2024-02-17 ENCOUNTER — Ambulatory Visit

## 2024-02-18 ENCOUNTER — Ambulatory Visit
Admission: RE | Admit: 2024-02-18 | Discharge: 2024-02-18 | Disposition: A | Discharge status: Home / Self Care | Source: Ambulatory Visit | Attending: Nurse Practitioner | Admitting: Nurse Practitioner

## 2024-02-18 DIAGNOSIS — Z1231 Encounter for screening mammogram for malignant neoplasm of breast: Secondary | ICD-10-CM

## 2024-02-24 ENCOUNTER — Ambulatory Visit: Payer: Self-pay | Admitting: Nurse Practitioner

## 2024-03-29 ENCOUNTER — Encounter: Payer: Self-pay | Admitting: Physician Assistant

## 2024-03-29 ENCOUNTER — Other Ambulatory Visit (HOSPITAL_COMMUNITY)
Admission: RE | Admit: 2024-03-29 | Discharge: 2024-03-29 | Disposition: A | Discharge status: Home / Self Care | Source: Ambulatory Visit | Attending: Physician Assistant | Admitting: Physician Assistant

## 2024-03-29 ENCOUNTER — Ambulatory Visit: Admitting: Physician Assistant

## 2024-03-29 VITALS — BP 137/83 | HR 89 | Ht 66.0 in | Wt 277.0 lb

## 2024-03-29 DIAGNOSIS — Z1231 Encounter for screening mammogram for malignant neoplasm of breast: Secondary | ICD-10-CM

## 2024-03-29 DIAGNOSIS — Z113 Encounter for screening for infections with a predominantly sexual mode of transmission: Secondary | ICD-10-CM | POA: Insufficient documentation

## 2024-03-29 DIAGNOSIS — Z01419 Encounter for gynecological examination (general) (routine) without abnormal findings: Secondary | ICD-10-CM | POA: Insufficient documentation

## 2024-03-29 DIAGNOSIS — Z124 Encounter for screening for malignant neoplasm of cervix: Secondary | ICD-10-CM | POA: Diagnosis not present

## 2024-03-29 NOTE — Progress Notes (Signed)
 ANNUAL EXAM Patient name: Katie Morgan MRN [id number]  Date of birth: 29-Apr-1986 Chief Complaint:   Establish Care (Annual Exam)  History of Present Illness:   Katie Morgan is a 38 y.o. No obstetric history on file. female being seen today for a routine annual exam.   Current complaints: TTC, female partner   Patient's last menstrual period was 03/23/2024 (approximate).   The pregnancy intention screening data noted above was reviewed. Potential methods of contraception were discussed. The patient elected to proceed with No data recorded.   Last pap 11/06/21. Results were: NILM H/O abnormal pap: no Last mammogram: 02/18/24. Results were: normal. Family h/o breast cancer: yes, maternal aunt and grandmother.  Last colonoscopy: never done. Results were: N/A. Family h/o colorectal cancer: no STI screening: Accepts Contraception: None     06/28/2023    3:03 PM 11/06/2021    8:22 AM 09/03/2020    8:28 AM 08/09/2018    9:33 AM  Depression screen PHQ 2/9  Decreased Interest 0 0 0 0  Down, Depressed, Hopeless 0 0 0 0  PHQ - 2 Score 0 0 0 0         No data to display           Review of Systems:   Pertinent items are noted in HPI Denies any headaches, blurred vision, fatigue, shortness of breath, chest pain, abdominal pain, abnormal vaginal discharge/itching/odor/irritation, problems with periods, bowel movements, urination, or intercourse unless otherwise stated above. Pertinent History Reviewed:  Reviewed past medical,surgical, social and family history.  Reviewed problem list, medications and allergies. Physical Assessment:   Vitals:   03/29/24 1531  BP: 137/83  Pulse: 89  Weight: 277 lb (125.6 kg)  Height: 5' 6 (1.676 m)  Body mass index is 44.71 kg/m.        Physical Examination:   General appearance - well appearing, and in no distress  Mental status - alert, oriented to person, place, and time  Psych:  She has a normal mood and affect  Skin - warm and  dry, normal color, no suspicious lesions noted  Chest - effort normal, all lung fields clear to auscultation bilaterally  Heart - normal rate and regular rhythm  Neck:  midline trachea, no thyromegaly or nodules  Breasts - breasts appear normal, no suspicious masses, no skin or nipple changes or  axillary nodes  Abdomen - soft, nontender, nondistended, no masses or organomegaly  Pelvic - VULVA: normal appearing vulva with no masses, tenderness or lesions  VAGINA: normal appearing vagina with normal color and discharge, no lesions  CERVIX: normal appearing cervix without discharge or lesions, no CMT  UTERUS: uterus is felt to be normal size, shape, consistency and nontender   ADNEXA: No adnexal masses or tenderness noted.  Extremities:  No swelling or varicosities noted  Chaperone present for exam  No results found for this or any previous visit (from the past 24 hours).  Assessment & Plan:  1. Encounter for annual routine gynecological examination (Primary) - Cervical cancer screening: Discussed guidelines. Pap with HPV due 2028 - STD Testing: accepts - Birth Control: None, female partner - Breast Health: Encouraged self breast awareness/SBE. Teaching provided. Discussed limits of clinical breast exam for detecting breast cancer. MXR is up to date: 2025 - Colonoscopy: @ 40yo, or sooner if problems - F/U 12 months and prn  - Ambulatory referral to Endocrinology  2. Cervical cancer screening  3. Breast cancer screening by mammogram Patient states no first  degree relative with breast cancer. Will remove from problem list and history.    No orders of the defined types were placed in this encounter.  Meds: No orders of the defined types were placed in this encounter.  Follow-up: No follow-ups on file.  Kathlee Barnhardt E Kameko Hukill, [place] 03/29/2024 3:49 PM

## 2024-03-29 NOTE — Progress Notes (Signed)
 No concerns today. Interested in possibly becoming pregnant.

## 2024-03-30 LAB — HEPATITIS B SURFACE ANTIBODY, QUANTITATIVE: Hepatitis B Surf Ab Quant: 9.3 m[IU]/mL — ABNORMAL LOW

## 2024-03-30 LAB — CERVICOVAGINAL ANCILLARY ONLY
Bacterial Vaginitis (gardnerella): NEGATIVE
Candida Glabrata: NEGATIVE
Candida Vaginitis: NEGATIVE
Chlamydia: NEGATIVE
Comment: NEGATIVE
Comment: NEGATIVE
Comment: NEGATIVE
Comment: NEGATIVE
Comment: NEGATIVE
Comment: NORMAL
Neisseria Gonorrhea: NEGATIVE
Trichomonas: NEGATIVE

## 2024-03-30 LAB — HEPATITIS C ANTIBODY: Hep C Virus Ab: NONREACTIVE

## 2024-03-30 LAB — RPR: RPR Ser Ql: NONREACTIVE

## 2024-03-30 LAB — HIV ANTIBODY (ROUTINE TESTING W REFLEX): HIV Screen 4th Generation wRfx: NONREACTIVE

## 2024-04-01 ENCOUNTER — Ambulatory Visit: Payer: Self-pay | Admitting: Physician Assistant

## 2024-04-17 ENCOUNTER — Encounter: Payer: Self-pay | Admitting: Radiology

## 2024-04-20 ENCOUNTER — Encounter: Payer: Self-pay | Admitting: Nurse Practitioner

## 2024-04-20 NOTE — Telephone Encounter (Signed)
 Scheduled pt to see Dr Vita tomorrow  11/7

## 2024-04-21 ENCOUNTER — Ambulatory Visit: Admitting: Family Medicine

## 2024-04-21 ENCOUNTER — Ambulatory Visit
Admission: RE | Admit: 2024-04-21 | Discharge: 2024-04-21 | Disposition: A | Discharge status: Home / Self Care | Source: Ambulatory Visit | Attending: Family Medicine | Admitting: Family Medicine

## 2024-04-21 ENCOUNTER — Encounter: Payer: Self-pay | Admitting: Family Medicine

## 2024-04-21 VITALS — BP 128/88 | HR 88 | Wt 278.6 lb

## 2024-04-21 DIAGNOSIS — G8929 Other chronic pain: Secondary | ICD-10-CM | POA: Diagnosis not present

## 2024-04-21 DIAGNOSIS — M25552 Pain in left hip: Secondary | ICD-10-CM | POA: Diagnosis not present

## 2024-04-21 MED ORDER — METHYLPREDNISOLONE 4 MG PO TBPK
ORAL_TABLET | ORAL | 0 refills | Status: DC
Start: 2024-04-21 — End: 2024-05-05

## 2024-04-21 NOTE — Patient Instructions (Signed)
 Please go get your xrays done at [facility]. You do not need to make an appointment. You can just show up.   Address: [address] [place], [place], [place] [address]

## 2024-04-21 NOTE — Progress Notes (Signed)
 Name: Katie Morgan   Date of Visit: 04/21/24   Date of last visit with me: Visit date not found   CHIEF COMPLAINT:  Chief Complaint  Patient presents with   Acute Visit    Left thigh, starting to have a tingling numb feeling if position or stand a certain way too long. started at the beginning of the week. It doesn't hurt, but it's very annoying starring to get concerned. tried stretching, but it doesn't work.        HPI:  Discussed the use of AI scribe software for clinical note transcription with the patient, who gave verbal consent to proceed.  History of Present Illness   Katie Morgan is a 38 year old female who presents with recurrent sciatic pain and tingling in the leg.  She experiences symptoms similar to an episode last year when she was diagnosed with sciatica after visiting the emergency room. At that time, she was unable to walk due to the severity of the pain.  Currently, she describes tingling and numbness in the same leg, which has recently started to hurt, particularly when standing. The current episode began earlier this week, with tingling being the predominant symptom until pain started yesterday. The pain is localized and does not radiate to her buttock or down the back of her leg. She describes the pain as irritating rather than intensely painful.  During the previous episode, she underwent x-rays at [facility], although she is uncertain if they included her back. She mentions having had x-rays of her shoulders and hips for other issues, but no specific imaging of her back was noted in the records.  She stands most of the day, which may contribute to her symptoms. She has previously been treated with a course of steroids, which eventually provided relief. No pain radiating down the back of her leg; the tingling and pain are localized to the front of her hip and leg area.         OBJECTIVE:       03/29/2024    4:03 PM  Depression screen  PHQ 2/9  Decreased Interest 0  Down, Depressed, Hopeless 0  PHQ - 2 Score 0  Altered sleeping 0  Tired, decreased energy 0  Change in appetite 0  Feeling bad or failure about yourself  0  Trouble concentrating 0  Moving slowly or fidgety/restless 0  Suicidal thoughts 0  PHQ-9 Score 0      Data saved with a previous flowsheet row definition     BP Readings from Last 3 Encounters:  04/21/24 128/88  03/29/24 137/83  06/28/23 130/82    BP 128/88   Pulse 88   Wt 278 lb 9.6 oz (126.4 kg)   LMP 03/23/2024 (Approximate)   SpO2 98%   BMI 44.97 kg/m      Physical Exam   Inspection reveals no gross abnormalities of the left hip.  There is some mild tenderness to palpation of the inguinal region of the left hip.  There is noted pain with FABER and FADIR as well as internal/external rotation of the hip.  Pain is located mostly in the groin as well as some lateral pain likely over the greater trochanter.  There is significant weakness with flexion against resistance of the hip. 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 Symptoms suggest nerve involvement, possibly due to hip impingement rather than back issues. Differential includes nerve compression in the hip versus arthritis pain. - Ordered repeat hip x-rays at [facility] to assess changes. - Prescribed Medrol  Dosepak given possible nerve pain as well as some arthritic pain.  Did discuss with patient that in the future she may need to be on some alternative anti-inflammatory medications however for now we will do a short course of steroids that she had that last year with some mild benefit. - Will reevaluate patient in 4 weeks after x-rays and determine if patient would benefit from injection or some other therapy. - Advised icing the  front of the hip, particularly the groin area, for 20 minutes at a time. - Scheduled follow-up in four weeks to review x-ray results and treatment response. - Instructed to contact if no improvement with steroids for potential earlier intervention.         Kenyanna Grzesiak A. Vita MD [facility]

## 2024-04-24 ENCOUNTER — Institutional Professional Consult (permissible substitution) (INDEPENDENT_AMBULATORY_CARE_PROVIDER_SITE_OTHER): Admitting: Physician Assistant

## 2024-05-05 ENCOUNTER — Ambulatory Visit: Admitting: Medical

## 2024-05-05 ENCOUNTER — Encounter: Payer: Self-pay | Admitting: Nurse Practitioner

## 2024-05-05 VITALS — BP 112/80 | HR 91 | Temp 97.2°F | Wt 274.4 lb

## 2024-05-05 DIAGNOSIS — R300 Dysuria: Secondary | ICD-10-CM | POA: Diagnosis not present

## 2024-05-05 DIAGNOSIS — R350 Frequency of micturition: Secondary | ICD-10-CM | POA: Diagnosis not present

## 2024-05-05 LAB — POCT URINALYSIS DIP (PROADVANTAGE DEVICE)
Bilirubin, UA: NEGATIVE
Blood, UA: NEGATIVE
Glucose, UA: NEGATIVE mg/dL
Ketones, POC UA: NEGATIVE mg/dL
Leukocytes, UA: NEGATIVE
Nitrite, UA: NEGATIVE
Protein Ur, POC: NEGATIVE mg/dL
Specific Gravity, Urine: 1.025
Urobilinogen, Ur: NEGATIVE
pH, UA: 6 (ref 5.0–8.0)

## 2024-05-05 MED ORDER — OXYBUTYNIN CHLORIDE 5 MG PO TABS: 5.0000 mg | ORAL_TABLET | Freq: Two times a day (BID) | ORAL | 0 refills | Status: DC

## 2024-05-05 MED ORDER — NITROFURANTOIN MONOHYD MACRO 100 MG PO CAPS: 100.0000 mg | ORAL_CAPSULE | Freq: Two times a day (BID) | ORAL | 0 refills | Status: DC

## 2024-05-05 NOTE — Progress Notes (Signed)
 Subjective: Chief Complaint  Patient presents with   Acute Visit    Frequent urination since starting on medication     Katie Morgan is a 38 year old female who presents with increased urinary frequency and abdominal pain.  She has experienced a significant increase in urinary frequency, urinating approximately twenty times a day, which began after starting prednisone  prescribed by a sports doctor. There is no increased thirst or recent weight changes. She is attempting to drink more water and reduce soda intake. Her current medications include Protonix  and vitamin D . She denies any history of prediabetes or diabetes, and a blood sugar test at the beginning of the year was normal. An STD panel in October was also normal.  She describes the onset of abdominal pain that began last night, located in the stomach area. The pain 'just came out of nowhere' and is not associated with any specific activities or changes in her routine. No fever, chills, or changes in soap products. No vaginal discharge or odor, and she has not experienced any urinary incontinence or accidents. Her menstrual periods have been irregular since she started, with the last period ending within the past week.  No burning sensation during urination, urgency, odor, blood in the urine, or fever. She is not sexually active and has not had intercourse recently.  Sexual preference is female  No other aggravating or relieving factors. No other complaint.  Past Medical History:  Diagnosis Date   Anemia    Morbid obesity (HCC)    Routine screening for STI (sexually transmitted infection) 05/11/2023   Severe obesity (BMI >= 40) (HCC) 04/06/2016   Shoulder impingement, left 06/11/2022   Vaginal laceration, initial encounter 02/11/2023   Current Outpatient Medications on File Prior to Visit  Medication Sig Dispense Refill   pantoprazole  (PROTONIX ) 40 MG tablet TAKE 1 TABLET(40 MG) BY MOUTH DAILY 30 tablet 5   Vitamin D ,  Ergocalciferol , (DRISDOL ) 1.25 MG (50000 UNIT) CAPS capsule Take 1 capsule (50,000 Units total) by mouth every 7 (seven) days. 12 capsule 0   No current facility-administered medications on file prior to visit.    ROS as in subjective    Objective: BP 112/80   Pulse 91   Temp (!) 97.2 F (36.2 C)   Wt 274 lb 6.4 oz (124.5 kg)   BMI 44.29 kg/m   Gen: wd, wn ,nad Abdomen: Mild left lower quadrant tenderness, otherwise nontender, no mass, no organomegaly Back nontender GU/GYN-declined   Assessment: Encounter Diagnoses  Name Primary?   Urine frequency Yes   Dysuria      Plan Urinary frequency and dysuria evaluation Urinalysis normal.   Culture sent. Differential includes adverse effect from recent prednisone  use, overactive bladder, urinary tract infection, soap product causing irritation, diabetes, and anatomical issues.  Overactive bladder possible. Anatomical issues less likely but require further investigation. -If any symptoms over the weekend suggesting worsening UTI such as worse burning, blood in urine, odor in urine, worse pain then again Macrobid  antibiotic - Ordered urine culture. - Checked blood glucose levels. Not diabetic. - Consider overactive bladder medication if urine culture negative and blood glucose normal. - Refer to urologist if symptoms persist and initial tests inconclusive.  Zafiro was seen today for acute visit.  Diagnoses and all orders for this visit:  Urine frequency -     POCT Urinalysis DIP (Proadvantage Device) -     Urine Culture  Dysuria -     Urine Culture  Other orders -  nitrofurantoin , macrocrystal-monohydrate, (MACROBID ) 100 MG capsule; Take 1 capsule (100 mg total) by mouth 2 (two) times daily. -     oxybutynin  (DITROPAN ) 5 MG tablet; Take 1 tablet (5 mg total) by mouth 2 (two) times daily.   F/u pending culture

## 2024-05-08 ENCOUNTER — Ambulatory Visit: Payer: Self-pay | Admitting: Medical

## 2024-05-08 LAB — URINE CULTURE

## 2024-05-08 NOTE — Progress Notes (Signed)
 Results to MyChart

## 2024-05-08 NOTE — Progress Notes (Signed)
Patient viewed results on Mychart

## 2024-05-09 ENCOUNTER — Other Ambulatory Visit: Payer: Self-pay | Admitting: Medical

## 2024-05-09 MED ORDER — AMOXICILLIN 500 MG PO TABS: 500.0000 mg | ORAL_TABLET | Freq: Three times a day (TID) | ORAL | 0 refills | Status: DC

## 2024-05-09 NOTE — Progress Notes (Signed)
 Section symptomsSee message from yesterday.  It is okay if she used the antibiotic given there is some bacteria but typically this amount of bacteria would not cause a significant infection symptom  I want to make sure she got my message yesterday and began the oxybutynin  to help with urinary frequency, possible overactive bladder

## 2024-05-15 ENCOUNTER — Institutional Professional Consult (permissible substitution) (INDEPENDENT_AMBULATORY_CARE_PROVIDER_SITE_OTHER): Admitting: Nurse Practitioner

## 2024-05-22 ENCOUNTER — Ambulatory Visit: Admitting: Family Medicine

## 2024-05-22 ENCOUNTER — Other Ambulatory Visit: Payer: Self-pay | Admitting: Medical

## 2024-05-26 ENCOUNTER — Other Ambulatory Visit: Payer: Self-pay | Admitting: Nurse Practitioner

## 2024-05-26 DIAGNOSIS — K219 Gastro-esophageal reflux disease without esophagitis: Secondary | ICD-10-CM

## 2024-06-22 ENCOUNTER — Institutional Professional Consult (permissible substitution) (INDEPENDENT_AMBULATORY_CARE_PROVIDER_SITE_OTHER): Admitting: Nurse Practitioner

## 2024-06-27 NOTE — Progress Notes (Signed)
 "  Katie Doing, DNP, AGNP-c [facility] [address] [place], [place] [address] Main Office [phone] VISIT TYPE: CPE on 06/29/2024 Today's Vitals   06/29/24 1403  BP: 128/82  Pulse: 67  Weight: 277 lb 6.4 oz (125.8 kg)  Height: 5' 5.5 (1.664 m)   Body mass index is 45.46 kg/m.  Wt Readings from Last 3 Encounters:  06/29/24 277 lb 6.4 oz (125.8 kg)  05/05/24 274 lb 6.4 oz (124.5 kg)  04/21/24 278 lb 9.6 oz (126.4 kg)     Subjective:  Annual Exam (CPE fasting labs, knot on bottom of lt. Foot, been there 4-5 months, )  History of Present Illness Katie Morgan is a 39 year old female who presents for her annual exam.   She has a knot on the bottom of her left foot, which appeared suddenly and has been present for a while. It is not painful during ambulation but causes discomfort when pressed firmly. She has attempted various interventions, such as rolling her foot on objects and using different shoes and insoles, without relief. The knot remains the size of a small olive and has not increased in size. 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  She has a history of using Wegovy  for weight management but experienced severe side effects, including significant weight loss and gastrointestinal distress, after missing a dose. She is interested in restarting the medication at a lower dose.  She has a past issue with her hepatitis B vaccine, administered in the seventh grade. Recent tests indicated low immunity levels, and she is considering redoing the vaccine series.  She has a history of frequent streptococcal pharyngitis during her school years, resulting in an enlarged lymph node that has remained unchanged for years. She recalls a severe episode in 2009 that required a steroid injection.  She is experiencing changes in her menstrual cycle, with increased dysmenorrhea. She is considering conception with her partner and is  concerned about potential fibroids, although a recent OB GYN visit did not reveal any issues.  She reports a recent illness and has fluid behind her right eardrum. She experiences tinnitus but has been informed that her hearing is improving.  She is planning to conceive with her partner and is considering using a sperm donor for at-home insemination. She is aware of potential challenges due to her age but is eager to proceed. Pertinent items are noted in HPI.     06/29/2024    2:03 PM 03/29/2024    4:03 PM 06/28/2023    3:03 PM 11/06/2021    8:22 AM 09/03/2020    8:28 AM  Depression screen PHQ 2/9  Decreased Interest 0 0 0 0 0  Down, Depressed, Hopeless 0 0 0 0 0  PHQ - 2 Score 0 0 0 0 0  Altered sleeping  0     Tired, decreased energy  0     Change in appetite  0     Feeling bad or failure about yourself   0     Trouble concentrating  0     Moving slowly or fidgety/restless  0     Suicidal thoughts  0     PHQ-9 Score  0         Data saved with a previous flowsheet row definition       03/29/2024    4:03 PM  GAD 7 : Generalized Anxiety Score  Nervous, Anxious, on Edge 0  Control/stop worrying 0   Worry too much - different things 0   Trouble relaxing 0   Restless 0   Easily annoyed or irritable 0   Afraid - awful might happen 0   Total GAD 7 Score 0     Data saved with a previous flowsheet row definition       06/29/2024    2:03 PM 06/28/2023    3:03 PM 11/06/2021    8:21 AM 09/03/2020    8:28 AM 08/09/2018    9:33 AM  Fall Risk   Falls in the past year? 0 0 0 0 0   Number falls in past yr: 0 0 0 0 0   Injury with Fall? 0 0  0  0  0   Risk for fall due to : No Fall Risks No Fall Risks No Fall Risks    Follow up Falls evaluation completed Falls evaluation completed Falls evaluation completed        Data saved with a previous flowsheet row definition   Past medical history, surgical history, medications, allergies, family history and social history reviewed with  patient today and changes made to appropriate areas of the chart.      Objective:    Physical Exam Vitals and nursing note reviewed.  Constitutional:      General: She is not in acute distress.    Appearance: Normal appearance.  HENT:     Head: Normocephalic and atraumatic.     Right Ear: Hearing, tympanic membrane, ear canal and external ear normal.     Left Ear: Hearing, tympanic membrane, ear canal and external ear normal.     Nose: Nose normal.     Right Sinus: No maxillary sinus tenderness or frontal sinus tenderness.     Left Sinus: No maxillary sinus tenderness or frontal sinus tenderness.     Mouth/Throat:     Lips: Pink.     Mouth: Mucous membranes are moist.     Pharynx: Oropharynx is clear.  Eyes:     General: Lids are normal. Vision grossly intact.     Extraocular Movements: Extraocular movements intact.     Conjunctiva/sclera: Conjunctivae normal.     Pupils: Pupils are equal, round, and reactive to light.     Funduscopic exam:    Right eye: Red reflex present.        Left eye: Red reflex present.    Visual Fields: Right eye visual fields normal and left eye visual fields normal.  Neck:     Thyroid: No thyromegaly.     Vascular: No carotid bruit.  Cardiovascular:     Rate and Rhythm: Normal rate and regular rhythm.     Chest Wall: PMI is not displaced.     Pulses: Normal pulses.          Dorsalis pedis pulses are 2+ on the right side and 2+ on the left side.       Posterior tibial pulses are 2+ on the right side and 2+ on the left side.     Heart sounds: Normal heart sounds. No murmur heard. Pulmonary:     Effort: Pulmonary effort is normal. No respiratory distress.     Breath sounds: Normal breath sounds.  Abdominal:     General: Abdomen is flat. Bowel sounds are normal. There is no distension.     Palpations: Abdomen is soft. There is no hepatomegaly, splenomegaly or mass.     Tenderness: There is no abdominal tenderness. There is  no right CVA tenderness,  left CVA tenderness, guarding or rebound.  Musculoskeletal:        General: Normal range of motion.     Cervical back: Full passive range of motion without pain, normal range of motion and neck supple. No tenderness.     Right lower leg: No edema.     Left lower leg: No edema.     Comments: Firm nodularity noted on the plantar surface of the left foot.   Feet:     Left foot:     Toenail Condition: Left toenails are normal.  Lymphadenopathy:     Cervical: No cervical adenopathy.     Upper Body:     Right upper body: No supraclavicular adenopathy.     Left upper body: No supraclavicular adenopathy.  Skin:    General: Skin is warm and dry.     Capillary Refill: Capillary refill takes less than 2 seconds.     Nails: There is no clubbing.  Neurological:     General: No focal deficit present.     Mental Status: She is alert and oriented to person, place, and time.     GCS: GCS eye subscore is 4. GCS verbal subscore is 5. GCS motor subscore is 6.     Sensory: Sensation is intact.     Motor: Motor function is intact.     Coordination: Coordination is intact.     Gait: Gait is intact.     Deep Tendon Reflexes: Reflexes are normal and symmetric.  Psychiatric:        Attention and Perception: Attention normal.        Mood and Affect: Mood normal.        Speech: Speech normal.        Behavior: Behavior normal. Behavior is cooperative.        Thought Content: Thought content normal.        Cognition and Memory: Cognition and memory normal.        Judgment: Judgment normal.         Assessment & Plan:   Assessment & Plan Encounter for annual physical exam CPE completed today. Review of HM activities and recommendations discussed and provided on AVS. Anticipatory guidance, diet, and exercise recommendations provided. Medications, allergies, and hx reviewed and updated as necessary. Orders placed as listed below.  Plan: - Labs ordered. Will make changes as necessary based on results.   - I will review these results and send recommendations via MyChart or a telephone call.  - F/U with CPE in 1 year or sooner for acute/chronic health needs as directed.      Fibroma of foot, left Presence of a fibroma on the left foot, likely a nodule on a tendon or ligament. No numbness or tingling reported. Pain occurs after prolonged standing or walking, but not during activity. Differential diagnosis includes plantar fasciitis, but fibroma is more likely. - Referred to podiatry for evaluation and possible steroid injection to shrink the fibroma. Orders:   Ambulatory referral to Podiatry  Primary hypertension Blood pressure is well-controlled. No changes in the plan of care. Previous notes, medications, and labs reviewed today. New labs ordered.   Orders:   CBC with Differential/Platelet   CMP14+EGFR   Hemoglobin A1c   Lipid panel   semaglutide -weight management (WEGOVY ) 0.25 MG/0.5ML SOAJ SQ injection; Inject 0.25 mg into the skin once a week.  BMI 45.0-49.9, adult [facility]) Previous use of Wegovy , which was discontinued due to adverse effects. Interested in  restarting weight management medication. Discussed the option of starting Wegovy  at a lower dose and gradually increasing as tolerated. Insurance coverage for Wegovy  is available through a specific app. - Prescribed Wegovy , starting at a low dose and titrating up as tolerated. - Ensured insurance coverage through the specified app. Orders:   CBC with Differential/Platelet   CMP14+EGFR   Hemoglobin A1c   Lipid panel   semaglutide -weight management (WEGOVY ) 0.25 MG/0.5ML SOAJ SQ injection; Inject 0.25 mg into the skin once a week.  Vitamin D  deficiency Repeat labs for evaluation and management today.  Orders:   VITAMIN D  25 Hydroxy (Vit-D Deficiency, Fractures)  Elevated lipids Will repeat labs. Currently diet and exercise managed. Diet and exercise recommendations include low carb, low fat meals and snacks with at least 64  ounces of water a day. Aim for at least 30 grams of fiber a day through diet and supplement. Avoid snacks and drinks with high calorie and no nutritional value. Aim for at least 20 minutes of a brisk walk (or similar) most days of the week.   Orders:   CBC with Differential/Platelet   CMP14+EGFR   Hemoglobin A1c   Lipid panel  Pelvic pain Increased pelvic pain and abnormal menstruation, with more painful cycles. Differential diagnosis includes fibroids, which can cause painful and heavy periods. Previous OB GYN evaluation did not include an ultrasound. - Ordered pelvic ultrasound to evaluate for fibroids. Orders:   US  Pelvic Complete With Transvaginal; Future  Need for hepatitis B vaccination  Orders:   Heplisav-B  (HepB-CPG) Vaccine  NEXT PREVENTATIVE PHYSICAL DUE IN 1 YEAR.    PATIENT COUNSELING PROVIDED FOR ALL ADULT PATIENTS: A well balanced diet low in saturated fats, cholesterol, and moderation in carbohydrates.  This can be as simple as monitoring portion sizes and cutting back on sugary beverages such as soda and juice to start with.    Daily water consumption of at least 64 ounces.  Physical activity at least 180 minutes per week.  If just starting out, start 10 minutes a day and work your way up.   This can be as simple as taking the stairs instead of the elevator and walking 2-3 laps around the office  purposefully every day.   STD protection, partner selection, and regular testing if high risk.  Limited consumption of alcoholic beverages if alcohol is consumed. For men, I recommend no more than 14 alcoholic beverages per week, spread out throughout the week (max 2 per day). Avoid binge drinking or consuming large quantities of alcohol in one setting.  Please let me know if you feel you may need help with reduction or quitting alcohol consumption.   Avoidance of nicotine, if used. Please let me know if you feel you may need help with reduction or quitting nicotine  use.   Daily mental health attention. This can be in the form of 5 minute daily meditation, prayer, journaling, yoga, reflection, etc.  Purposeful attention to your emotions and mental state can significantly improve your overall wellbeing  and Health.  Please know that I am here to help you with all of your health care goals and am happy to work with you to find a solution that works best for you.  The greatest advice I have received with any changes in life are to take it one step at a time, that even means if all you can focus on is the next 60 seconds, then do that and celebrate your victories.  With any changes in life,  you will have set backs, and that is OK. The important thing to remember is, if you have a set back, it is not a failure, it is an opportunity to try again! Screening Testing Mammogram Every 1 -2 years based on history and risk factors Starting at age 71 Pap Smear Ages 21-39 every 3 years Ages 48-65 every 5 years with HPV testing More frequent testing may be required based on results and history Colon Cancer Screening Every 1-10 years based on test performed, risk factors, and history Starting at age 25 Bone Density Screening Every 2-10 years based on history Starting at age 327 for women Recommendations for men differ based on medication usage, history, and risk factors AAA Screening One time ultrasound Men 34-35 years old who have every smoked Lung Cancer Screening Low Dose Lung CT every 12 months Age 39-80 years with a 30 pack-year smoking history who still smoke or who have quit within the last 15 years   Screening Labs Routine  Labs: Complete Blood Count (CBC), Complete Metabolic Panel (CMP), Cholesterol (Lipid Panel) Every 6-12 months based on history and medications May be recommended more frequently based on current conditions or previous results Hemoglobin A1c Lab Every 3-12 months based on history and previous results Starting at age 68 or earlier  with diagnosis of diabetes, high cholesterol, BMI >26, and/or risk factors Frequent monitoring for patients with diabetes to ensure blood sugar control Thyroid Panel (TSH) Every 6 months based on history, symptoms, and risk factors May be repeated more often if on medication HIV One time testing for all patients 47 and older May be repeated more frequently for patients with increased risk factors or exposure Hepatitis C One time testing for all patients 76 and older May be repeated more frequently for patients with increased risk factors or exposure Gonorrhea, Chlamydia Every 12 months for all sexually active persons 13-24 years Additional monitoring may be recommended for those who are considered high risk or who have symptoms Every 12 months for any woman on birth control, regardless of sexual activity PSA Men 33-38 years old with risk factors Additional screening may be recommended from age 52-69 based on risk factors, symptoms, and history  Vaccine Recommendations Tetanus Booster All adults every 10 years Flu Vaccine All patients 6 months and older every year COVID Vaccine All patients 12 years and older Initial dosing with booster May recommend additional booster based on age and health history HPV Vaccine 2 doses all patients age 32-26 Dosing may be considered for patients over 26 Shingles Vaccine (Shingrix) 2 doses all adults 55 years and older Pneumonia (Pneumovax 63) All adults 65 years and older May recommend earlier dosing based on health history One year apart from Prevnar 49 Pneumonia (Prevnar 2) All adults 65 years and older Dosed 1 year after Pneumovax 23 Pneumonia (Prevnar 20) One time alternative to the two dosing of 13 and 23 For all adults with initial dose of 23, 20 is recommended 1 year later For all adults with initial dose of 13, 23 is still recommended as second option 1 year later        "

## 2024-06-29 ENCOUNTER — Ambulatory Visit: Payer: Commercial Managed Care - PPO | Admitting: Nurse Practitioner

## 2024-06-29 VITALS — BP 128/82 | HR 67 | Ht 65.5 in | Wt 277.4 lb

## 2024-06-29 DIAGNOSIS — R102 Pelvic and perineal pain unspecified side: Secondary | ICD-10-CM | POA: Diagnosis not present

## 2024-06-29 DIAGNOSIS — D2122 Benign neoplasm of connective and other soft tissue of left lower limb, including hip: Secondary | ICD-10-CM | POA: Diagnosis not present

## 2024-06-29 DIAGNOSIS — Z0000 Encounter for general adult medical examination without abnormal findings: Secondary | ICD-10-CM

## 2024-06-29 DIAGNOSIS — Z6841 Body Mass Index (BMI) 40.0 and over, adult: Secondary | ICD-10-CM | POA: Diagnosis not present

## 2024-06-29 DIAGNOSIS — Z23 Encounter for immunization: Secondary | ICD-10-CM

## 2024-06-29 DIAGNOSIS — E785 Hyperlipidemia, unspecified: Secondary | ICD-10-CM

## 2024-06-29 DIAGNOSIS — E559 Vitamin D deficiency, unspecified: Secondary | ICD-10-CM | POA: Diagnosis not present

## 2024-06-29 DIAGNOSIS — I10 Essential (primary) hypertension: Secondary | ICD-10-CM

## 2024-06-29 DIAGNOSIS — D509 Iron deficiency anemia, unspecified: Secondary | ICD-10-CM

## 2024-06-29 LAB — LIPID PANEL

## 2024-06-29 MED ORDER — WEGOVY 0.25 MG/0.5ML ~~LOC~~ SOAJ: 0.2500 mg | SUBCUTANEOUS | 0 refills | Status: AC

## 2024-06-29 NOTE — Patient Instructions (Signed)
 VISIT SUMMARY:  During your visit, we discussed several health concerns, including a knot on the bottom of your left foot, changes in your menstrual cycle, weight management, and hepatitis B immunity. We have created a plan to address each of these issues.  YOUR PLAN:  -LEFT FOOT FIBROMA: A fibroma is a benign growth on the tendon or ligament of the foot. You have been referred to a podiatrist for further evaluation and possible steroid injection to shrink the fibroma.  -PELVIC PAIN: Your increased pelvic pain may be due to fibroids, which are non-cancerous growths in the uterus that can cause painful and heavy periods. We have ordered a pelvic ultrasound to evaluate for fibroids.  -OBESITY: We discussed restarting Wegovy , a weight management medication, at a lower dose to minimize side effects. You will start at a low dose and gradually increase as tolerated. We have ensured that your insurance will cover this medication through a specific app.  -PRIMARY HYPERTENSION: Your blood pressure is well-controlled. Continue with your current management plan.  -GENERAL HEALTH MAINTENANCE: Your recent tests indicated low immunity to hepatitis B. We discussed the risks of hepatitis B infection, including potential progression to liver cancer if not cleared by the body. You have received the first dose of the hepatitis B vaccine series.  INSTRUCTIONS:  Please follow up with the podiatrist for your left foot fibroma evaluation and possible treatment. Schedule a pelvic ultrasound to check for fibroids. Begin taking Wegovy  at the prescribed low dose and use the specified app to ensure insurance coverage. Continue monitoring your blood pressure as advised. Complete the hepatitis B vaccine series as scheduled.

## 2024-06-30 LAB — LIPID PANEL
Cholesterol, Total: 205 mg/dL — AB (ref 100–199)
HDL: 63 mg/dL
LDL CALC COMMENT:: 3.3 ratio (ref 0.0–4.4)
LDL Chol Calc (NIH): 130 mg/dL — AB (ref 0–99)
Triglycerides: 69 mg/dL (ref 0–149)
VLDL Cholesterol Cal: 12 mg/dL (ref 5–40)

## 2024-06-30 LAB — CBC WITH DIFFERENTIAL/PLATELET
Basophils Absolute: 0 x10E3/uL (ref 0.0–0.2)
Basos: 0 %
EOS (ABSOLUTE): 0.2 x10E3/uL (ref 0.0–0.4)
Eos: 2 %
Hematocrit: 35.7 % (ref 34.0–46.6)
Hemoglobin: 10.3 g/dL — ABNORMAL LOW (ref 11.1–15.9)
Immature Grans (Abs): 0 x10E3/uL (ref 0.0–0.1)
Immature Granulocytes: 0 %
Lymphocytes Absolute: 3.4 x10E3/uL — ABNORMAL HIGH (ref 0.7–3.1)
Lymphs: 37 %
MCH: 22.2 pg — ABNORMAL LOW (ref 26.6–33.0)
MCHC: 28.9 g/dL — ABNORMAL LOW (ref 31.5–35.7)
MCV: 77 fL — ABNORMAL LOW (ref 79–97)
Monocytes Absolute: 0.5 x10E3/uL (ref 0.1–0.9)
Monocytes: 5 %
Neutrophils Absolute: 5.1 x10E3/uL (ref 1.4–7.0)
Neutrophils: 56 %
Platelets: 338 x10E3/uL (ref 150–450)
RBC: 4.64 x10E6/uL (ref 3.77–5.28)
RDW: 14.6 % (ref 11.7–15.4)
WBC: 9.3 x10E3/uL (ref 3.4–10.8)

## 2024-06-30 LAB — CMP14+EGFR
ALT: 11 IU/L (ref 0–32)
AST: 14 IU/L (ref 0–40)
Albumin: 4.1 g/dL (ref 3.9–4.9)
Alkaline Phosphatase: 77 IU/L (ref 41–116)
BUN/Creatinine Ratio: 18 (ref 9–23)
BUN: 13 mg/dL (ref 6–24)
Bilirubin Total: <0.2 mg/dL (ref 0.0–1.2)
CO2: 24 mmol/L (ref 20–29)
Calcium: 9.5 mg/dL (ref 8.7–10.2)
Chloride: 104 mmol/L (ref 96–106)
Creatinine, Ser: 0.71 mg/dL (ref 0.57–1.00)
Globulin, Total: 3.2 g/dL (ref 1.5–4.5)
Glucose: 84 mg/dL (ref 70–99)
Potassium: 4.1 mmol/L (ref 3.5–5.2)
Sodium: 142 mmol/L (ref 134–144)
Total Protein: 7.3 g/dL (ref 6.0–8.5)
eGFR: 110 mL/min/1.73

## 2024-06-30 LAB — HEMOGLOBIN A1C
Est. average glucose Bld gHb Est-mCnc: 108 mg/dL
Hgb A1c MFr Bld: 5.4 % (ref 4.8–5.6)

## 2024-06-30 LAB — VITAMIN D 25 HYDROXY (VIT D DEFICIENCY, FRACTURES): Vit D, 25-Hydroxy: 26.9 ng/mL — AB (ref 30.0–100.0)

## 2024-07-05 ENCOUNTER — Ambulatory Visit: Payer: Self-pay | Admitting: Nurse Practitioner

## 2024-07-05 DIAGNOSIS — D508 Other iron deficiency anemias: Secondary | ICD-10-CM

## 2024-07-06 LAB — IRON AND TIBC
Iron Saturation: 6 % — AB (ref 15–55)
Iron: 23 ug/dL — AB (ref 27–159)
Total Iron Binding Capacity: 364 ug/dL (ref 250–450)
UIBC: 341 ug/dL (ref 131–425)

## 2024-07-06 LAB — SPECIMEN STATUS REPORT

## 2024-07-07 ENCOUNTER — Ambulatory Visit
Admission: RE | Admit: 2024-07-07 | Discharge: 2024-07-07 | Disposition: A | Source: Ambulatory Visit | Attending: Nurse Practitioner

## 2024-07-07 DIAGNOSIS — R102 Pelvic and perineal pain unspecified side: Secondary | ICD-10-CM

## 2024-07-07 NOTE — Assessment & Plan Note (Signed)
 Will repeat labs. Currently diet and exercise managed. Diet and exercise recommendations include low carb, low fat meals and snacks with at least 64 ounces of water a day. Aim for at least 30 grams of fiber a day through diet and supplement. Avoid snacks and drinks with high calorie and no nutritional value. Aim for at least 20 minutes of a brisk walk (or similar) most days of the week.   Orders:   CBC with Differential/Platelet   CMP14+EGFR   Hemoglobin A1c   Lipid panel

## 2024-07-07 NOTE — Assessment & Plan Note (Signed)
 Repeat labs for evaluation and management today.  Orders:   VITAMIN D  25 Hydroxy (Vit-D Deficiency, Fractures)

## 2024-07-07 NOTE — Assessment & Plan Note (Signed)
 Blood pressure is well-controlled. No changes in the plan of care. Previous notes, medications, and labs reviewed today. New labs ordered.   Orders:   CBC with Differential/Platelet   CMP14+EGFR   Hemoglobin A1c   Lipid panel   semaglutide -weight management (WEGOVY ) 0.25 MG/0.5ML SOAJ SQ injection; Inject 0.25 mg into the skin once a week.

## 2024-07-07 NOTE — Assessment & Plan Note (Signed)
 Previous use of Wegovy , which was discontinued due to adverse effects. Interested in restarting weight management medication. Discussed the option of starting Wegovy  at a lower dose and gradually increasing as tolerated. Insurance coverage for Wegovy  is available through a specific app. - Prescribed Wegovy , starting at a low dose and titrating up as tolerated. - Ensured insurance coverage through the specified app. Orders:   CBC with Differential/Platelet   CMP14+EGFR   Hemoglobin A1c   Lipid panel   semaglutide -weight management (WEGOVY ) 0.25 MG/0.5ML SOAJ SQ injection; Inject 0.25 mg into the skin once a week.

## 2024-07-07 NOTE — Assessment & Plan Note (Signed)

## 2024-07-08 ENCOUNTER — Other Ambulatory Visit: Payer: Self-pay | Admitting: Nurse Practitioner

## 2024-07-08 DIAGNOSIS — Z6841 Body Mass Index (BMI) 40.0 and over, adult: Secondary | ICD-10-CM

## 2024-07-08 DIAGNOSIS — I10 Essential (primary) hypertension: Secondary | ICD-10-CM

## 2024-07-09 NOTE — Telephone Encounter (Signed)
 Refilled already

## 2024-07-10 ENCOUNTER — Ambulatory Visit: Admitting: Podiatry

## 2024-07-11 ENCOUNTER — Encounter: Payer: Self-pay | Admitting: Nurse Practitioner

## 2024-07-12 ENCOUNTER — Telehealth: Payer: Self-pay | Admitting: Pharmacy Technician

## 2024-07-12 ENCOUNTER — Telehealth (HOSPITAL_COMMUNITY): Payer: Self-pay | Admitting: Pharmacist

## 2024-07-12 ENCOUNTER — Telehealth: Payer: Self-pay | Admitting: Internal Medicine

## 2024-07-12 NOTE — Telephone Encounter (Signed)
" °  Katie Morgan, the patient will be scheduled as soon as possible.  Auth Submission: NO AUTH NEEDED Site of care: Site of care: [facility] Payer: [employer] commercial Medication & CPT/J Code(s) submitted: Venofer (Iron Sucrose) J1756 Diagnosis Code: D50.9 Route of submission (phone, fax, portal): [employer] portal Phone # Fax # Auth type: Buy/Bill PB Units/visits requested: 200 mg x 5 Reference number: [id number] Approval from: 07/12/2024 to 10/12/2024       "

## 2024-07-12 NOTE — Telephone Encounter (Signed)
 Treatment plan changed to Venofer 200mg  IV x 5 doses per payer preference and okay from [facility]  Indria Bishara, PharmD, MPH, BCPS, CPP Clinical Pharmacist

## 2024-07-12 NOTE — Telephone Encounter (Signed)
-----   Message from Bruno JONELLE Faith sent at 07/12/2024  8:26 AM EST ----- I will have the pharmacist switch it, thank you! ----- Message ----- From: Vicci Samule LABOR, CMA Sent: 07/12/2024   8:12 AM EST To: Krista R Layton, CPhT  Yes that will be fine. Can you switch it or do I need too? ----- Message ----- From: Faith Bruno JONELLE, CPhT Sent: 07/12/2024   8:03 AM EST To: Samule LABOR Vicci, CMA; Camie FORBES Doing, NP  Good morning, this patients insurance prefers Venofer. Would it be ok to switch to Venofer? Thank you, Krista

## 2024-07-13 ENCOUNTER — Ambulatory Visit (INDEPENDENT_AMBULATORY_CARE_PROVIDER_SITE_OTHER): Admitting: Podiatry

## 2024-07-13 ENCOUNTER — Encounter: Payer: Self-pay | Admitting: Podiatry

## 2024-07-13 DIAGNOSIS — M722 Plantar fascial fibromatosis: Secondary | ICD-10-CM | POA: Diagnosis not present

## 2024-07-13 DIAGNOSIS — M7989 Other specified soft tissue disorders: Secondary | ICD-10-CM | POA: Diagnosis not present

## 2024-07-13 MED ORDER — BETAMETHASONE SOD PHOS & ACET 6 (3-3) MG/ML IJ SUSP
4.0000 mg | Freq: Once | INTRAMUSCULAR | Status: AC
  Administered 2024-07-13: 4.2 mg

## 2024-07-13 NOTE — Patient Instructions (Signed)
 Look for adhesive felt callus pads to offload the area of the plantar fibroma.  Dancers pads may work well for this as well.  Look for these on [employer].  You can also get rolls of quarter inch adhesive felt from [employer] or from [place].

## 2024-07-13 NOTE — Progress Notes (Signed)
"  °  Subjective:  Patient ID: Katie Morgan, female    DOB: 1985/11/21,  MRN: [id number]  Chief Complaint  Patient presents with   Foot Pain    Left foot pain medial arch x 5 months. Not diabetic. No anti coag    Discussed the use of AI scribe software for clinical note transcription with the patient, who gave verbal consent to proceed.  History of Present Illness Katie Morgan is a 39 year old female who presents with a persistent plantar left foot mass.  She noticed the plantar medial left midfoot mass about 4-5 months ago. It has not changed significantly in size or appearance.  She has localized irritation at the site, worsened by prolonged standing and walking at work, and by direct palpation. Symptoms are confined to the mass without radiation. She is aware of the mass at rest but pain is mainly with activity. She denies severe pain or other foot symptoms.  She tried an over-the-counter orthotic, which was ineffective and increased irritation. She has not used custom orthotics or other offloading. She has had steroid injections elsewhere in the past and felt the foot injection was more painful than prior injections.      Objective:    Physical Exam MUSCULOSKELETAL: Plantar fibroma mass on the left plantar midfoot, medial arch region, approximately 1 cm in length, tender on palpation. No pain on palpation of plantar fascia at origin. Muscle strength 5/5 in dorsiflexion, plantarflexion inversion and eversion DP and PT pulses palpable 2/4 bilaterally.  Cap refill intact. Pedal skin within normal limits for skin texture and skin turgor. Gross light touch sensation intact   No images are attached to the encounter.    Results    Assessment:   1. Mass of soft tissue of foot   2. Plantar fibromatosis      Plan:  Patient was evaluated and treated and all questions answered.  Assessment and Plan Assessment & Plan Plantar fibromatosis of the left foot Tender,  palpable mass at left plantar midfoot consistent with benign plantar fibromatosis. Conservative management appropriate due to current severity and impact. Surgical excision not indicated. -Corticosteroid injection administered as discussed below. - Provided felt pads and demonstrated offloading techniques to reduce pressure. - Recommended trial of various adhesive medical felt pads (callus pads, horseshoe pads, dancer's pads) to determine optimal offloading. - Advised obtaining quarter-inch thick adhesive medical felt pads, with sourcing suggestions ([employer], 21M). - Scheduled follow-up in four weeks to assess response.  Procedure: Injection Tendon/Ligament Discussed alternatives, risks, complications and verbal consent was obtained.  Location: Left plantar fibroma nodule. Skin Prep: Alcohol. Injectate: 0.75 cc of 0.5% Marcaine  plain mixed with 0.75 cc betamethasone  Soluspan Disposition: Patient tolerated procedure well. Injection site dressed with a band-aid.  Post-injection care was discussed and return precautions discussed.        Return in about 4 weeks (around 08/10/2024) for plantar fibroma.      "

## 2024-07-17 ENCOUNTER — Ambulatory Visit

## 2024-07-20 ENCOUNTER — Other Ambulatory Visit: Payer: Self-pay | Admitting: Nurse Practitioner

## 2024-07-20 DIAGNOSIS — Z6841 Body Mass Index (BMI) 40.0 and over, adult: Secondary | ICD-10-CM

## 2024-07-20 DIAGNOSIS — R030 Elevated blood-pressure reading, without diagnosis of hypertension: Secondary | ICD-10-CM

## 2024-07-21 ENCOUNTER — Ambulatory Visit

## 2024-07-21 VITALS — BP 116/68 | HR 64 | Temp 98.1°F | Resp 16 | Ht 66.0 in | Wt 276.6 lb

## 2024-07-21 DIAGNOSIS — D508 Other iron deficiency anemias: Secondary | ICD-10-CM

## 2024-07-21 MED ORDER — IRON SUCROSE 200 MG IVPB - SIMPLE MED
200.0000 mg | Freq: Once | Status: AC
  Administered 2024-07-21: 200 mg via INTRAVENOUS

## 2024-07-21 NOTE — Progress Notes (Signed)
 Diagnosis: , Iron  Deficiency Anemia  Provider:  Mannam, Praveen MD  Procedure: IV Infusion  IV Type: Peripheral, IV Location: R Antecubital  , Venofer  (Iron  Sucrose), Dose: 200 mg  Infusion Start Time: 1458  Infusion Stop Time: 1515  Post Infusion IV Care: Observation period completed and Peripheral IV Discontinued  Discharge: Condition: Good, Destination: Home . AVS Provided  Performed by:  Taffy Delconte, RN

## 2024-07-25 ENCOUNTER — Ambulatory Visit

## 2024-07-31 ENCOUNTER — Other Ambulatory Visit

## 2024-08-01 ENCOUNTER — Ambulatory Visit

## 2024-08-09 ENCOUNTER — Ambulatory Visit: Payer: Self-pay | Admitting: Obstetrics and Gynecology

## 2024-08-14 ENCOUNTER — Ambulatory Visit: Admitting: Podiatry

## 2025-07-05 ENCOUNTER — Encounter: Admitting: Nurse Practitioner
